# Patient Record
Sex: Male | Born: 1995 | Race: White | Hispanic: No | Smoking: Never smoker
Health system: Southern US, Community
[De-identification: ages and names within clinical notes are randomized; demographics above are authoritative.]

## PROBLEM LIST (undated history)

## (undated) DIAGNOSIS — K219 Gastro-esophageal reflux disease without esophagitis: Secondary | ICD-10-CM

---

## 2014-06-26 ENCOUNTER — Ambulatory Visit: Payer: Self-pay | Admitting: Family Medicine

## 2014-06-26 ENCOUNTER — Encounter (INDEPENDENT_AMBULATORY_CARE_PROVIDER_SITE_OTHER): Payer: Self-pay

## 2014-06-26 ENCOUNTER — Encounter: Payer: Self-pay | Admitting: Family Medicine

## 2014-06-26 VITALS — BP 111/62 | HR 53 | Temp 97.7°F | Ht 71.0 in | Wt 150.0 lb

## 2014-06-26 DIAGNOSIS — L255 Unspecified contact dermatitis due to plants, except food: Secondary | ICD-10-CM

## 2014-06-26 DIAGNOSIS — L237 Allergic contact dermatitis due to plants, except food: Secondary | ICD-10-CM

## 2014-06-26 NOTE — Patient Instructions (Signed)
Poison Oak Poison oak is an inflammation of the skin (contact dermatitis). It is caused by contact with the allergens on the leaves of the oak (toxicodendron) plants. Depending on your sensitivity, the rash may consist simply of redness and itching, or it may also progress to blisters which may break open (rupture). These must be well cared for to prevent secondary germ (bacterial) infection as these infections can lead to scarring. The eyes may also get puffy. The puffiness is worst in the morning and gets better as the day progresses. Healing is best accomplished by keeping any open areas dry, clean, covered with a bandage, and covered with an antibacterial ointment if needed. Without secondary infection, this dermatitis usually heals without scarring within 2 to 3 weeks without treatment. HOME CARE INSTRUCTIONS When you have been exposed to poison oak, it is very important to thoroughly wash with soap and water as soon as the exposure has been discovered. You have about one half hour to remove the plant resin before it will cause the rash. This cleaning will quickly destroy the oil or antigen on the skin (the antigen is what causes the rash). Wash aggressively under the fingernails as any plant resin still there will continue to spread the rash. Do not rub skin vigorously when washing affected area. Poison oak cannot spread if no oil from the plant remains on your body. Rash that has progressed to weeping sores (lesions) will not spread the rash unless you have not washed thoroughly. It is also important to clean any clothes you have been wearing as they may carry active allergens which will spread the rash, even several days later. Avoidance of the plant in the future is the best measure. Poison oak plants can be recognized by the number of leaves. Generally, poison oak has three leaves with flowering branches on a single stem. Diphenhydramine may be purchased over the counter and used as needed for  itching. Do not drive with this medication if it makes you drowsy. Ask your caregiver about medication for children. SEEK IMMEDIATE MEDICAL CARE IF:   Open areas of the rash develop.  You notice redness extending beyond the area of the rash.  There is a pus like discharge.  There is increased pain.  Other signs of infection develop (such as fever). Document Released: 05/19/2003 Document Revised: 02/04/2012 Document Reviewed: 09/28/2009 ExitCare Patient Information 2015 [employer]. This information is not intended to replace advice given to you by your health care provider. Make sure you discuss any questions you have with your health care provider.  

## 2014-06-26 NOTE — Progress Notes (Signed)
   Subjective:    Patient ID: Bradley BignessRyan Dorsey, male    DOB: 05-31-1996, 18 y.o.   MRN: [id number]  HPI This [age] y.o. male presents for evaluation of contact dermatitis and rash on forearms from poison oak exposure. He was out camping and was in poison oak and has rash on arms that is resolving. He has been using caladryl cream and it is a lot better.  He is joining the [employer] and [employer] wants a letter addressing his rash from his PCP.   Review of Systems C/o rash bilateral arms No chest pain, SOB, HA, dizziness, vision change, N/V, diarrhea, constipation, dysuria, urinary urgency or frequency, myalgias, arthralgias.     Objective:   Physical Exam  Vital signs noted  Well developed well nourished male.  HEENT - Head atraumatic Normocephalic                Eyes - PERRLA, Conjuctiva - clear Sclera- Clear EOMI                Ears - EAC's Wnl TM's Wnl Gross Hearing WNL Respiratory - Lungs CTA bilateral Cardiac - RRR S1 and S2 without murmur GI - Abdomen soft Nontender and bowel sounds active x 4 Skin - erythematous macular papular rash on arms.      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  Deatra CanterWilliam J Oxford FNP

## 2020-04-01 ENCOUNTER — Ambulatory Visit
Admission: EM | Admit: 2020-04-01 | Discharge: 2020-04-01 | Disposition: A | Discharge status: Home / Self Care | Payer: 59 | Attending: Emergency Medicine | Admitting: Emergency Medicine

## 2020-04-01 ENCOUNTER — Telehealth: Payer: Self-pay

## 2020-04-01 ENCOUNTER — Other Ambulatory Visit: Payer: Self-pay

## 2020-04-01 ENCOUNTER — Encounter: Payer: Self-pay | Admitting: Emergency Medicine

## 2020-04-01 DIAGNOSIS — Z113 Encounter for screening for infections with a predominantly sexual mode of transmission: Secondary | ICD-10-CM | POA: Diagnosis not present

## 2020-04-01 DIAGNOSIS — R300 Dysuria: Secondary | ICD-10-CM | POA: Diagnosis not present

## 2020-04-01 DIAGNOSIS — J302 Other seasonal allergic rhinitis: Secondary | ICD-10-CM | POA: Diagnosis not present

## 2020-04-01 LAB — POCT URINALYSIS DIP (MANUAL ENTRY)
Bilirubin, UA: NEGATIVE
Glucose, UA: NEGATIVE mg/dL
Ketones, POC UA: NEGATIVE mg/dL
Leukocytes, UA: NEGATIVE
Nitrite, UA: NEGATIVE
Protein Ur, POC: NEGATIVE mg/dL
Spec Grav, UA: 1.025 (ref 1.010–1.025)
Urobilinogen, UA: 0.2 E.U./dL
pH, UA: 6 (ref 5.0–8.0)

## 2020-04-01 MED ORDER — PHENAZOPYRIDINE HCL 100 MG PO TABS: 100.0000 mg | ORAL_TABLET | Freq: Three times a day (TID) | ORAL | 0 refills | Status: DC | PRN

## 2020-04-01 MED ORDER — FLUTICASONE PROPIONATE 50 MCG/ACT NA SUSP
1.0000 | Freq: Every day | NASAL | 0 refills | Status: DC
Start: 2020-04-01 — End: 2020-04-01

## 2020-04-01 MED ORDER — FLUTICASONE PROPIONATE 50 MCG/ACT NA SUSP: 1.0000 | Freq: Every day | NASAL | 0 refills | Status: DC

## 2020-04-01 MED ORDER — PHENAZOPYRIDINE HCL 100 MG PO TABS
100.0000 mg | ORAL_TABLET | Freq: Three times a day (TID) | ORAL | 0 refills | Status: DC | PRN
Start: 2020-04-01 — End: 2020-04-01

## 2020-04-01 NOTE — Discharge Instructions (Signed)
POCT urine analysis showed trace of blood.  Inconclusive for UTI. Urine will be sent for culture.  Someone will call with abnormal result. STD screening was completed Pyridium will be prescribed for symptomatic relief.  Continue to take Claritin as prescribed Flonase was prescribed Rest and push fluids Return or follow up with PCP in 24 hours to be reevaluated and to ensure your symptoms are improving Return sooner or go to the ED if you have any new or worsening symptoms such as difficulty breathing, shortness of breath, chest pain, nausea, vomiting, throat tightness or swelling, tongue swelling or tingling, worsening lip or facial swelling, abdominal pain, changes in bowel or bladder habits, no improvement despite medications, etc..Marland Kitchen

## 2020-04-01 NOTE — ED Provider Notes (Signed)
[facility]   [id number] 04/01/20 Arrival Time: 6301  Cc: Allergic reaction/ dysuria   SUBJECTIVE:  Bradley Dorsey is a 24 y.o. male who presents with possible allergic reaction that began 4-5 days ago.  Denies precipitating event, exposure, known allergy or trigger. Denies changes in medication or starting a new medication.  Has tried OTC medication without relief.  Symptoms are made worse with being outdoor.  Reports previous symptoms in the past.   Denies fever, chills, nausea, vomiting, erythema, redness, swollen glands, oral manifestations such as throat swelling/ tingling, mouth swelling/ tingling, tongue swelling/tingling, dyspnea, SOB, chest pain, abdominal pain, changes in bowel or bladder function.     He is also reporting dysuria for the past 3 days.  Denies any precipitating event.  Denies penile discharge.  Reports history of STD and UTI in the past.  ROS: As per HPI.  All other pertinent ROS negative.     History reviewed. No pertinent past medical history. History reviewed. No pertinent surgical history. No Known Allergies No current facility-administered medications on file prior to encounter.   No current outpatient medications on file prior to encounter.    Social History   Socioeconomic History  . Marital status: Single    Spouse name: Not on file  . Number of children: Not on file  . Years of education: Not on file  . Highest education level: Not on file  Occupational History  . Not on file  Tobacco Use  . Smoking status: Never Smoker  . Smokeless tobacco: Never Used  Substance and Sexual Activity  . Alcohol use: No  . Drug use: No  . Sexual activity: Yes  Other Topics Concern  . Not on file  Social History Narrative  . Not on file   Social Determinants of Health   Financial Resource Strain:   . Difficulty of Paying Living Expenses:   Food Insecurity:   . Worried About Programme researcher, broadcasting/film/video in the Last Year:   . Barista in the Last  Year:   Transportation Needs:   . Freight forwarder (Medical):   Marland Kitchen Lack of Transportation (Non-Medical):   Physical Activity:   . Days of Exercise per Week:   . Minutes of Exercise per Session:   Stress:   . Feeling of Stress :   Social Connections:   . Frequency of Communication with Friends and Family:   . Frequency of Social Gatherings with Friends and Family:   . Attends Religious Services:   . Active Member of Clubs or Organizations:   . Attends Banker Meetings:   Marland Kitchen Marital Status:   Intimate Partner Violence:   . Fear of Current or Ex-Partner:   . Emotionally Abused:   Marland Kitchen Physically Abused:   . Sexually Abused:    History reviewed. No pertinent family history.   OBJECTIVE:  Vitals:   04/01/20 0811  BP: 115/76  Pulse: (!) 59  Resp: 16  Temp: 98.1 F (36.7 C)  TempSrc: Oral  SpO2: 98%     General appearance: Alert, speaking in full sentences without difficulty HEENT:NCAT; no obvious facial swelling; Ears: EACs clear, TMs pearly gray; Eyes: PERRL.  EOM grossly intact. Nose: nares patent without rhinorrhea; Throat: tonsils nonerythematous or enlarged, uvula midline Neck: supple without LAD Lungs: clear to auscultation bilaterally without adventitious breath sounds; normal respiratory effort; no labored respirations Heart: regular rate and rhythm.  Radial pulses 2+ symmetrical bilaterally; cap refill < 2 seconds Abdomen: soft, nondistended,  normal active bowel sounds; nontender to palpation; no guarding, no CVA tenderness Skin: warm and dry Psychological: alert and cooperative; normal mood and affect  ASSESSMENT & PLAN:  1. Seasonal allergies   2. Dysuria   3. Screen for STD (sexually transmitted disease)     Meds ordered this encounter  Medications  . fluticasone (FLONASE) 50 MCG/ACT nasal spray    Sig: Place 1 spray into both nostrils daily for 14 days.    Dispense:  16 g    Refill:  0  . phenazopyridine (PYRIDIUM) 100 MG tablet     Sig: Take 1 tablet (100 mg total) by mouth 3 (three) times daily as needed for pain.    Dispense:  15 tablet    Refill:  0    Orders Placed This Encounter  Procedures  . POCT urinalysis dipstick    Standing Status:   Standing    Number of Occurrences:   1     Discharge instruction POCT urine analysis showed trace of blood.  Inconclusive for UTI. Urine will be sent for culture.  Someone will call with abnormal result. STD screening was completed Pyridium will be prescribed for symptomatic relief.  Continue to take Claritin as prescribed for allergy Flonase was prescribed Rest and push fluids Return or follow up with PCP in 24 hours to be reevaluated and to ensure your symptoms are improving Return sooner or go to the ED if you have any new or worsening symptoms such as difficulty breathing, shortness of breath, chest pain, nausea, vomiting, throat tightness or swelling, tongue swelling or tingling, worsening lip or facial swelling, abdominal pain, changes in bowel or bladder habits, no improvement despite medications, etc...   Reviewed expectations re: course of current medical issues. Questions answered. Outlined signs and symptoms indicating need for more acute intervention. Patient verbalized understanding. After Visit Summary given.          Emerson Monte,  [place] 04/01/20 [phone]

## 2020-04-01 NOTE — ED Triage Notes (Signed)
Pt presents with c/o nasal congestion and dysuria, reports h/o kidney stones

## 2020-04-04 LAB — CYTOLOGY, (ORAL, ANAL, URETHRAL) ANCILLARY ONLY
Chlamydia: NEGATIVE
Comment: NEGATIVE
Comment: NEGATIVE
Comment: NORMAL
Neisseria Gonorrhea: NEGATIVE
Trichomonas: NEGATIVE

## 2020-04-07 ENCOUNTER — Other Ambulatory Visit: Payer: Self-pay

## 2020-04-07 ENCOUNTER — Ambulatory Visit
Admission: EM | Admit: 2020-04-07 | Discharge: 2020-04-07 | Disposition: A | Discharge status: Home / Self Care | Payer: No Typology Code available for payment source | Attending: Emergency Medicine | Admitting: Emergency Medicine

## 2020-04-07 DIAGNOSIS — R002 Palpitations: Secondary | ICD-10-CM | POA: Diagnosis not present

## 2020-04-07 NOTE — ED Triage Notes (Signed)
Pt presents with c/o heart palpations on and of for past month or more. Pt states he feels heart flutter about 3 times today

## 2020-04-07 NOTE — Discharge Instructions (Addendum)
Your EKG showed normal sinus bradycardia with sinus arrhythmia otherwise is a normal EKG. Follow-up with PCP for further evaluation and referral to cardiology Avoid coffee, tea, soft drinks and energy drinks, chocolate, alcohol, smoking. Effective stress management is recommended

## 2020-04-07 NOTE — ED Provider Notes (Addendum)
[facility]    CSN: [id number] Arrival date & time: 04/07/20  1400      History   Chief Complaint Chief Complaint  Patient presents with  . Palpitations    HPI Bradley Dorsey is a 24 y.o. male.   Presented to the urgent care for complaint of heart palpitations for the past month.  Denies a precipitating event, or trauma.  Describes the episodes as  rapid fluttering.  Denies any precipitating or alleviating factor.  Has not used or tried any OTC medication.  Denies worsening symptoms or unidentified aggravating factors.  Reports similar symptoms in the past.  Denies fever, chills, nausea, vomiting, chest pain, SOB, syncope, dizziness, abdominal pain, weakness, changes in bowel or bladder habits.    The history is provided by the patient. No language interpreter was used.    No past medical history on file.  There are no problems to display for this patient.   No past surgical history on file.     Home Medications    Prior to Admission medications   Medication Sig Start Date End Date Taking? Authorizing Provider  fluticasone (FLONASE) 50 MCG/ACT nasal spray Place 1 spray into both nostrils daily for 14 days. 04/01/20 04/15/20  Jurnie Garritano, Zachery Dakins, FNP  phenazopyridine (PYRIDIUM) 100 MG tablet Take 1 tablet (100 mg total) by mouth 3 (three) times daily as needed for pain. 04/01/20   Jorel Gravlin, Zachery Dakins, FNP    Family History No family history on file.  Social History Social History   Tobacco Use  . Smoking status: Never Smoker  . Smokeless tobacco: Never Used  Substance Use Topics  . Alcohol use: Yes  . Drug use: No     Allergies   Patient has no known allergies.   Review of Systems Review of Systems  Constitutional: Negative.   HENT: Negative.   Respiratory: Negative.   Cardiovascular: Positive for palpitations.  Neurological: Negative.   All other systems reviewed and are negative.    Physical Exam Triage Vital Signs ED Triage Vitals  [04/07/20 1409]  Enc Vitals Group     BP      Pulse      Resp      Temp      Temp src      SpO2      Weight      Height      Head Circumference      Peak Flow      Pain Score 0     Pain Loc      Pain Edu?      Excl. in GC?    No data found.  Updated Vital Signs BP 130/79   Pulse (!) 54   Temp 97.9 F (36.6 C)   Resp 18   SpO2 98%   Visual Acuity Right Eye Distance:   Left Eye Distance:   Bilateral Distance:    Right Eye Near:   Left Eye Near:    Bilateral Near:     Physical Exam Vitals and nursing note reviewed.  Constitutional:      General: He is not in acute distress.    Appearance: Normal appearance. He is normal weight. He is not ill-appearing, toxic-appearing or diaphoretic.  Neck:     Vascular: No carotid bruit.  Cardiovascular:     Rate and Rhythm: Regular rhythm. Bradycardia present.     Pulses: Normal pulses.     Heart sounds: Normal heart sounds. No murmur. No friction rub. No  gallop.   Pulmonary:     Effort: Pulmonary effort is normal. No respiratory distress.     Breath sounds: Normal breath sounds. No stridor. No wheezing, rhonchi or rales.  Chest:     Chest wall: No tenderness.  Musculoskeletal:     Cervical back: Normal range of motion. No rigidity or tenderness.     Right lower leg: No edema.     Left lower leg: No edema.  Lymphadenopathy:     Cervical: No cervical adenopathy.  Neurological:     Mental Status: He is alert and oriented to person, place, and time.     Cranial Nerves: No cranial nerve deficit.     Sensory: No sensory deficit.     Motor: No weakness.     Coordination: Coordination normal.     Gait: Gait normal.     Deep Tendon Reflexes: Reflexes normal.      UC Treatments / Results  Labs (all labs ordered are listed, but only abnormal results are displayed) Labs Reviewed - No data to display  EKG   Radiology No results found.  Procedures Procedures (including critical care time)  Medications Ordered in  UC Medications - No data to display  Initial Impression / Assessment and Plan / UC Course  I have reviewed the triage vital signs and the nursing notes.  Pertinent labs & imaging results that were available during my care of the patient were reviewed by me and considered in my medical decision making (see chart for details).     Patient is stable at discharge.    EKG shows sinus bradycardia with sinus arrhythmia, otherwise normal EKG.  No narrowing or widening of the QRS complexes.  I have reviewed these EKG  with Dr. Lynelle Doctor.     Patient was advised to follow-up with PCP for further evaluation and possible referral to cardiology.  Final Clinical Impressions(s) / UC Diagnoses   Final diagnoses:  Heart palpitations     Discharge Instructions     Your EKG showed normal sinus bradycardia with sinus arrhythmia otherwise is a normal EKG. Follow-up with PCP for further evaluation and referral to cardiology Avoid coffee, tea, soft drinks and energy drinks, chocolate, alcohol, smoking. Effective stress management is recommended    ED Prescriptions    None     PDMP not reviewed this encounter.   Emerson Monte, FNP 04/07/20 1443    Emerson Monte, FNP 04/07/20 1449    Emerson Monte, FNP 04/07/20 1450

## 2020-04-18 ENCOUNTER — Other Ambulatory Visit: Payer: Self-pay

## 2020-04-18 ENCOUNTER — Ambulatory Visit
Admission: EM | Admit: 2020-04-18 | Discharge: 2020-04-18 | Disposition: A | Discharge status: Home / Self Care | Payer: 59 | Attending: Emergency Medicine | Admitting: Emergency Medicine

## 2020-04-18 DIAGNOSIS — Z113 Encounter for screening for infections with a predominantly sexual mode of transmission: Secondary | ICD-10-CM

## 2020-04-18 DIAGNOSIS — R300 Dysuria: Secondary | ICD-10-CM

## 2020-04-18 MED ORDER — AZITHROMYCIN 500 MG PO TABS
1000.0000 mg | ORAL_TABLET | Freq: Once | ORAL | Status: AC
  Administered 2020-04-18: 1000 mg via ORAL

## 2020-04-18 MED ORDER — CEFTRIAXONE SODIUM 500 MG IJ SOLR
500.0000 mg | Freq: Once | INTRAMUSCULAR | Status: AC
  Administered 2020-04-18: 500 mg via INTRAMUSCULAR

## 2020-04-18 NOTE — Discharge Instructions (Addendum)
Given rocephin 500 mg injection and azithromycin 1g in office Urethral swab obtained HIV/ syphilis testing today We will follow up with you regarding the results of your test If tests are positive, please abstain from sexual activity until you and your partner(s) are treated Follow up with PCP as needed Return here or go to ER if you have any new or worsening symptoms fever, chills, nausea, vomiting, redness, swelling, discharge, worsening symptoms despite treatment, etc..Marland Kitchen

## 2020-04-18 NOTE — ED Provider Notes (Signed)
[facility]   [id number] 04/18/20 Arrival Time: 3474   CC: CONCERN FOR STD  SUBJECTIVE:  Bradley Dorsey is a 24 y.o. male who presents requesting STI screening.  Dysuria x 1 week. Concerned for chlamydia.   Partner asymptomatic.  Last unprotected sexual encounter 2 weeks ago.  Sexually active with 3 male partners.  Denies similar symptoms in the past.  Denies fever, chills, nausea, vomiting, abdominal or pelvic pain, penile rashes or lesions, testicular swelling or pain.     ROS: As per HPI.  All other pertinent ROS negative.     History reviewed. No pertinent past medical history. History reviewed. No pertinent surgical history. No Known Allergies No current facility-administered medications on file prior to encounter.   Current Outpatient Medications on File Prior to Encounter  Medication Sig Dispense Refill  . [DISCONTINUED] fluticasone (FLONASE) 50 MCG/ACT nasal spray Place 1 spray into both nostrils daily for 14 days. 16 g 0   Social History   Socioeconomic History  . Marital status: Single    Spouse name: Not on file  . Number of children: Not on file  . Years of education: Not on file  . Highest education level: Not on file  Occupational History  . Not on file  Tobacco Use  . Smoking status: Never Smoker  . Smokeless tobacco: Never Used  Substance and Sexual Activity  . Alcohol use: Yes  . Drug use: No  . Sexual activity: Yes  Other Topics Concern  . Not on file  Social History Narrative  . Not on file   Social Determinants of Health   Financial Resource Strain:   . Difficulty of Paying Living Expenses:   Food Insecurity:   . Worried About Charity fundraiser in the Last Year:   . Arboriculturist in the Last Year:   Transportation Needs:   . Film/video editor (Medical):   Marland Kitchen Lack of Transportation (Non-Medical):   Physical Activity:   . Days of Exercise per Week:   . Minutes of Exercise per Session:   Stress:   . Feeling of Stress :    Social Connections:   . Frequency of Communication with Friends and Family:   . Frequency of Social Gatherings with Friends and Family:   . Attends Religious Services:   . Active Member of Clubs or Organizations:   . Attends Archivist Meetings:   Marland Kitchen Marital Status:   Intimate Partner Violence:   . Fear of Current or Ex-Partner:   . Emotionally Abused:   Marland Kitchen Physically Abused:   . Sexually Abused:    Family History  Problem Relation Age of Onset  . Healthy Mother   . Healthy Father     OBJECTIVE:  Vitals:   04/18/20 1801  BP: 125/75  Pulse: (!) 54  Resp: 17  Temp: 97.8 F (36.6 C)  TempSrc: Oral  SpO2: 96%     General appearance: alert, NAD, appears stated age Head: NCAT Throat: lips, mucosa, and tongue normal; teeth and gums normal Lungs: CTA bilaterally without adventitious breath sounds Heart: regular rate and rhythm.   Back: no CVA tenderness Abdomen: soft, non-tender; bowel sounds normal; no guarding GU: deferred Skin: warm and dry Psychological:  Alert and cooperative. Normal mood and affect.  LABS:  Results for orders placed or performed during the hospital encounter of 04/01/20  POCT urinalysis dipstick  Result Value Ref Range   Color, UA yellow yellow   Clarity, UA clear clear  Glucose, UA negative negative mg/dL   Bilirubin, UA negative negative   Ketones, POC UA negative negative mg/dL   Spec Grav, UA 9.211 9.417 - 1.025   Blood, UA trace-intact (A) negative   pH, UA 6.0 5.0 - 8.0   Protein Ur, POC negative negative mg/dL   Urobilinogen, UA 0.2 0.2 or 1.0 E.U./dL   Nitrite, UA Negative Negative   Leukocytes, UA Negative Negative  Cytology (oral, anal, urethral) ancillary only  Result Value Ref Range   Neisseria Gonorrhea Negative    Chlamydia Negative    Trichomonas Negative    Comment Normal Reference Range Trichomonas - Negative    Comment Normal Reference Ranger Chlamydia - Negative    Comment      Normal Reference Range  Neisseria Gonorrhea - Negative    Labs Reviewed  HIV ANTIBODY (ROUTINE TESTING W REFLEX)  [facility]  CYTOLOGY, (ORAL, ANAL, URETHRAL) ANCILLARY ONLY    ASSESSMENT & PLAN:  1. Dysuria   2. Screening examination for venereal disease     Meds ordered this encounter  Medications  . cefTRIAXone (ROCEPHIN) injection 500 mg  . azithromycin (ZITHROMAX) tablet 1,000 mg    Pending: Labs Reviewed  HIV ANTIBODY (ROUTINE TESTING W REFLEX)  [facility]  CYTOLOGY, (ORAL, ANAL, URETHRAL) ANCILLARY ONLY    Given rocephin 500 mg injection and azithromycin 1g in office Urethral swab obtained HIV/ syphilis testing today We will follow up with you regarding the results of your test If tests are positive, please abstain from sexual activity until you and your partner(s) are treated Follow up with PCP as needed Return here or go to ER if you have any new or worsening symptoms fever, chills, nausea, vomiting, redness, swelling, discharge, worsening symptoms despite treatment, etc...  Reviewed expectations re: course of current medical issues. Questions answered. Outlined signs and symptoms indicating need for more acute intervention. Patient verbalized understanding. After Visit Summary given.       Rennis Harding, PA-C 04/18/20 1817

## 2020-04-18 NOTE — ED Triage Notes (Addendum)
Pt presents with complaints of dysuria x over 1 week. Pt is concerned for chlamydia. Pt denies any other symptoms. Pt was recently treated for a uti with no relief which made him think the potential for STD. Pt states they also tested him for STD's and was negative, pt is concerned he did not preform self test correctly and would like to be retested.

## 2020-04-19 LAB — CYTOLOGY, (ORAL, ANAL, URETHRAL) ANCILLARY ONLY
Chlamydia: NEGATIVE
Comment: NEGATIVE
Comment: NEGATIVE
Comment: NORMAL
Neisseria Gonorrhea: NEGATIVE
Trichomonas: NEGATIVE

## 2020-04-19 LAB — HIV ANTIBODY (ROUTINE TESTING W REFLEX): HIV Screen 4th Generation wRfx: NONREACTIVE

## 2020-04-19 LAB — RPR: RPR Ser Ql: NONREACTIVE

## 2020-07-05 ENCOUNTER — Other Ambulatory Visit: Payer: Self-pay

## 2020-07-05 ENCOUNTER — Encounter (HOSPITAL_COMMUNITY): Payer: Self-pay | Admitting: Emergency Medicine

## 2020-07-05 ENCOUNTER — Ambulatory Visit
Admission: EM | Admit: 2020-07-05 | Discharge: 2020-07-05 | Disposition: A | Discharge status: Home / Self Care | Payer: 59 | Attending: Emergency Medicine | Admitting: Emergency Medicine

## 2020-07-05 ENCOUNTER — Encounter: Payer: Self-pay | Admitting: Emergency Medicine

## 2020-07-05 ENCOUNTER — Emergency Department (HOSPITAL_COMMUNITY)
Admission: EM | Admit: 2020-07-05 | Discharge: 2020-07-05 | Disposition: A | Discharge status: Home / Self Care | Payer: No Typology Code available for payment source | Attending: Emergency Medicine | Admitting: Emergency Medicine

## 2020-07-05 DIAGNOSIS — R5383 Other fatigue: Secondary | ICD-10-CM | POA: Insufficient documentation

## 2020-07-05 DIAGNOSIS — R0789 Other chest pain: Secondary | ICD-10-CM | POA: Diagnosis not present

## 2020-07-05 DIAGNOSIS — J069 Acute upper respiratory infection, unspecified: Secondary | ICD-10-CM

## 2020-07-05 DIAGNOSIS — J209 Acute bronchitis, unspecified: Secondary | ICD-10-CM | POA: Insufficient documentation

## 2020-07-05 DIAGNOSIS — R05 Cough: Secondary | ICD-10-CM | POA: Diagnosis not present

## 2020-07-05 DIAGNOSIS — Z5321 Procedure and treatment not carried out due to patient leaving prior to being seen by health care provider: Secondary | ICD-10-CM | POA: Diagnosis not present

## 2020-07-05 DIAGNOSIS — Z20822 Contact with and (suspected) exposure to covid-19: Secondary | ICD-10-CM | POA: Diagnosis not present

## 2020-07-05 LAB — SARS CORONAVIRUS 2 BY RT PCR (HOSPITAL ORDER, PERFORMED IN ~~LOC~~ HOSPITAL LAB): SARS Coronavirus 2: NEGATIVE

## 2020-07-05 MED ORDER — PREDNISONE 10 MG PO TABS
20.0000 mg | ORAL_TABLET | Freq: Every day | ORAL | 0 refills | Status: DC
Start: 2020-07-05 — End: 2020-07-05

## 2020-07-05 MED ORDER — PREDNISONE 10 MG PO TABS
20.0000 mg | ORAL_TABLET | Freq: Every day | ORAL | 0 refills | Status: DC
Start: 2020-07-05 — End: 2020-10-20

## 2020-07-05 NOTE — ED Provider Notes (Signed)
[facility] CARE CENTER   [id number] 07/05/20 Arrival Time: 1639   Chief Complaint  Patient presents with  . Cough  . Fatigue     SUBJECTIVE: History from: patient.  Bradley Dorsey is a 24 y.o. male who presents who presented to the urgent care for complaint of cough, fatigue, body ache for the past few 2 weeks.  Was seen in ER and was given azithromycin.  Patient went to [facility], ED has a rapid Covid test done today.  Is negative.  Denies sick exposure to COVID, flu or strep.  Denies recent travel.  Denies alleviating or aggravating factors.  Denies previous symptoms in the past.   Denies fever, chills, fatigue, sinus pain, rhinorrhea, sore throat, SOB, wheezing, chest pain, nausea, changes in bowel or bladder habits.    While in office patient state he is having some mild chest pain.  Reported chest pain as stabbing and sharp and burning at times.  Report he has history of abnormal heart rhythm.  Denies any precipitating event.  ROS: As per HPI.  All other pertinent ROS negative.     History reviewed. No pertinent past medical history. History reviewed. No pertinent surgical history. No Known Allergies No current facility-administered medications on file prior to encounter.   Current Outpatient Medications on File Prior to Encounter  Medication Sig Dispense Refill  . [DISCONTINUED] fluticasone (FLONASE) 50 MCG/ACT nasal spray Place 1 spray into both nostrils daily for 14 days. 16 g 0   Social History   Socioeconomic History  . Marital status: Single    Spouse name: Not on file  . Number of children: Not on file  . Years of education: Not on file  . Highest education level: Not on file  Occupational History  . Not on file  Tobacco Use  . Smoking status: Never Smoker  . Smokeless tobacco: Never Used  Substance and Sexual Activity  . Alcohol use: Yes  . Drug use: No  . Sexual activity: Yes  Other Topics Concern  . Not on file  Social History Narrative  . Not on file    Social Determinants of Health   Financial Resource Strain:   . Difficulty of Paying Living Expenses:   Food Insecurity:   . Worried About Programme researcher, broadcasting/film/video in the Last Year:   . Barista in the Last Year:   Transportation Needs:   . Freight forwarder (Medical):   Marland Kitchen Lack of Transportation (Non-Medical):   Physical Activity:   . Days of Exercise per Week:   . Minutes of Exercise per Session:   Stress:   . Feeling of Stress :   Social Connections:   . Frequency of Communication with Friends and Family:   . Frequency of Social Gatherings with Friends and Family:   . Attends Religious Services:   . Active Member of Clubs or Organizations:   . Attends Banker Meetings:   Marland Kitchen Marital Status:   Intimate Partner Violence:   . Fear of Current or Ex-Partner:   . Emotionally Abused:   Marland Kitchen Physically Abused:   . Sexually Abused:    Family History  Problem Relation Age of Onset  . Healthy Mother   . Healthy Father     OBJECTIVE:  Vitals:   07/05/20 1646 07/05/20 1654  BP: 106/70   Pulse: 60   Resp: 16   Temp: 98.4 F (36.9 C)   TempSrc: Oral   SpO2: 98%   Weight:  182  lb 15.7 oz (83 kg)  Height:  5\' 11"  (1.803 m)     General appearance: alert; appears fatigued, but nontoxic; speaking in full sentences and tolerating own secretions HEENT: NCAT; Ears: EACs clear, TMs pearly gray; Eyes: PERRL.  EOM grossly intact. Sinuses: nontender; Nose: nares patent without rhinorrhea, Throat: oropharynx clear, tonsils non erythematous or enlarged, uvula midline  Neck: supple without LAD Lungs: unlabored respirations, symmetrical air entry; cough: present; no respiratory distress; CTAB Heart: regular rate and rhythm.  Radial pulses 2+ symmetrical bilaterally Skin: warm and dry Psychological: alert and cooperative; normal mood and affect  LABS:  Results for orders placed or performed during the hospital encounter of 07/05/20 (from the past 24 hour(s))  SARS  Coronavirus 2 by RT PCR (hospital order, performed in [facility] hospital lab) Nasopharyngeal Nasopharyngeal Swab     Status: None   Collection Time: 07/05/20 12:13 PM   Specimen: Nasopharyngeal Swab  Result Value Ref Range   SARS Coronavirus 2 NEGATIVE NEGATIVE     ASSESSMENT & PLAN:  1. Viral URI with cough   2. Other chest pain     Meds ordered this encounter  Medications  . predniSONE (DELTASONE) 10 MG tablet    Sig: Take 2 tablets (20 mg total) by mouth daily.    Dispense:  15 tablet    Refill:  0   Patient is stable at discharge.  ED EKG showed sinus bradycardia with ST elevation, suggesting early  repolarization and or pericarditis.  EKG was reviewed with Dr.Haggler.09/04/20  He was advised to follow-up with PCP for referral to see a cardiologist.  Discharge Instructions   Get plenty of rest and push fluids Continue to take Tessalon Perles  for cough Low-dose prednisone was prescribed Follow-up and establish care with a PCP for referral to see a cardiologist Use medications daily for symptom relief Use OTC medications like ibuprofen or tylenol as needed fever or pain Call or go to the ED if you have any new or worsening symptoms such as fever, worsening cough, shortness of breath, chest tightness, chest pain, turning blue, changes in mental status, etc...   Reviewed expectations re: course of current medical issues. Questions answered. Outlined signs and symptoms indicating need for more acute intervention. Patient verbalized understanding. After Visit Summary given.      Note: This document was prepared using Dragon voice recognition software and may include unintentional dictation errors.    Marland Kitchen, FNP 07/05/20 [phone]

## 2020-07-05 NOTE — ED Triage Notes (Signed)
Pt states for the last 2 weeks has been having a non productive cough feeling fatigue. Pt denies fever or sob. Pt states he was diagnosed with bronchitis 1 week ago finished antibiotics but no better. Pt states he did have a chest xray but was not tested for covid.

## 2020-07-05 NOTE — Discharge Instructions (Addendum)
Get plenty of rest and push fluids Continue to take Tessalon Perles  for cough Low-dose prednisone was prescribed Use medications daily for symptom relief Use OTC medications like ibuprofen or tylenol as needed fever or pain Call or go to the ED if you have any new or worsening symptoms such as fever, worsening cough, shortness of breath, chest tightness, chest pain, turning blue, changes in mental status, etc..Marland Kitchen

## 2020-07-05 NOTE — ED Triage Notes (Signed)
Cough, body aches. Dx with bronchitis given abx last week. Had recent covid test. Still awaiting results.

## 2020-07-19 ENCOUNTER — Encounter (HOSPITAL_COMMUNITY): Payer: Self-pay | Admitting: Emergency Medicine

## 2020-07-19 ENCOUNTER — Other Ambulatory Visit: Payer: Self-pay

## 2020-07-19 DIAGNOSIS — Z79899 Other long term (current) drug therapy: Secondary | ICD-10-CM | POA: Insufficient documentation

## 2020-07-19 DIAGNOSIS — R001 Bradycardia, unspecified: Secondary | ICD-10-CM | POA: Insufficient documentation

## 2020-07-19 DIAGNOSIS — J40 Bronchitis, not specified as acute or chronic: Secondary | ICD-10-CM | POA: Insufficient documentation

## 2020-07-19 NOTE — ED Triage Notes (Signed)
Pt has a cough that has persisted for 4 weeks. Pt evaluated for the same. Last covid was negative 2 weeks ago.

## 2020-07-20 ENCOUNTER — Emergency Department (HOSPITAL_COMMUNITY)
Admission: EM | Admit: 2020-07-20 | Discharge: 2020-07-20 | Disposition: A | Discharge status: Home / Self Care | Payer: No Typology Code available for payment source | Attending: Emergency Medicine | Admitting: Emergency Medicine

## 2020-07-20 DIAGNOSIS — J40 Bronchitis, not specified as acute or chronic: Secondary | ICD-10-CM

## 2020-07-20 HISTORY — DX: Gastro-esophageal reflux disease without esophagitis: K21.9

## 2020-07-20 MED ORDER — AMOXICILLIN 500 MG PO CAPS
500.0000 mg | ORAL_CAPSULE | Freq: Three times a day (TID) | ORAL | 0 refills | Status: DC
Start: 2020-07-20 — End: 2020-10-20

## 2020-07-20 NOTE — Discharge Instructions (Addendum)
Take the antibiotic until gone.  Drink plenty of fluids.  You can continue the Delsym or take Mucinex DM over-the-counter for cough.  Recheck if you get a fever, struggle to breathe or seem worse.

## 2020-07-20 NOTE — ED Provider Notes (Signed)
[facility] EMERGENCY DEPARTMENT Provider Note   CSN: [id number] Arrival date & time: 07/19/20  2226   Time seen 3:45 AM  History Chief Complaint  Patient presents with  . Cough    Bradley Dorsey is a 24 y.o. male.  HPI   Patient states he has had a dry cough for the past 4 weeks.  He denies feeling short of breath or having fever.  He denies wheezing.  He states it sort of comes and goes.  He states he was seen at the urgent care which turned out to be August 10 in [place] and was prescribed prednisone.  He then was seen at [facility] in [place] and was taken off the prednisone and put on omeprazole.  He states his cough is not worse that she is not going away.  He states he has a scratchy throat but denies rhinorrhea or loss of taste or smell.  He states the past week his chest is tender if he touches it, it does not hurt if he coughs and he does not have pain unless he touches it.  He denies nausea, vomiting, or diarrhea.  He denies smoking or being around secondhand smoke.  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  PCP Patient, No Pcp Per   Past Medical History:  Diagnosis Date  . GERD (gastroesophageal reflux disease)     There are no problems to display for this patient.   History reviewed. No pertinent surgical history.     Family History  Problem Relation Age of Onset  . Healthy Mother   . Healthy Father     Social History   Tobacco Use  . Smoking status: Never Smoker  . Smokeless tobacco: Never Used  Substance Use Topics  . Alcohol use: Yes    Alcohol/week: 4.0 standard drinks    Types: 4 Cans of beer per week    Comment: occasionally  . Drug use: No  employed  Home Medications Prior to Admission medications   Medication Sig Start Date End Date Taking? Authorizing Provider  amoxicillin (AMOXIL) 500 MG capsule Take 1 capsule (500 mg total) by mouth 3 (three) times daily. 07/20/20   Devoria Albe, MD  predniSONE (DELTASONE) 10 MG tablet Take 2  tablets (20 mg total) by mouth daily. 07/05/20   Avegno, Zachery Dakins, FNP  fluticasone (FLONASE) 50 MCG/ACT nasal spray Place 1 spray into both nostrils daily for 14 days. 04/01/20 04/18/20  Durward Parcel, FNP    Allergies    Patient has no known allergies.  Review of Systems   Review of Systems  All other systems reviewed and are negative.   Physical Exam Updated Vital Signs BP 128/72 (BP Location: Right Arm)   Pulse (!) 57   Temp 98.3 F (36.8 C) (Oral)   Resp 15   Ht 5\' 11"  (1.803 m)   Wt 82.6 kg   SpO2 100%   BMI 25.38 kg/m   Physical Exam Vitals and nursing note reviewed.  Constitutional:      Appearance: Normal appearance. He is normal weight.     Comments: Patient coughed once during our interview  HENT:     Head: Normocephalic and atraumatic.     Right Ear: External ear normal.     Left Ear: External ear normal.  Eyes:     Extraocular Movements: Extraocular movements intact.     Conjunctiva/sclera: Conjunctivae normal.  Cardiovascular:     Rate and Rhythm: Regular rhythm. Bradycardia present.  Pulses: Normal pulses.     Heart sounds: Normal heart sounds.  Pulmonary:     Effort: Pulmonary effort is normal. No respiratory distress.     Breath sounds: Normal breath sounds. No wheezing or rales.  Musculoskeletal:        General: Normal range of motion.     Cervical back: Normal range of motion.  Skin:    General: Skin is warm and dry.  Neurological:     General: No focal deficit present.     Mental Status: He is alert and oriented to person, place, and time.     Cranial Nerves: No cranial nerve deficit.  Psychiatric:        Mood and Affect: Mood normal.        Behavior: Behavior normal.        Thought Content: Thought content normal.     ED Results / Procedures / Treatments   Labs (all labs ordered are listed, but only abnormal results are displayed) Labs Reviewed - No data to display  EKG None  Radiology No results  found.  Procedures Procedures (including critical care time)  Medications Ordered in ED Medications - No data to display  ED Course  I have reviewed the triage vital signs and the nursing notes.  Pertinent labs & imaging results that were available during my care of the patient were reviewed by me and considered in my medical decision making (see chart for details).    MDM Rules/Calculators/A&P                          Reviewing patient's chart he was seen August 1 at [facility] and prescribed [employer] and a Z-Pak.  He then was seen at [facility] urgent care on August 10, he had a negative Covid test that day.  He was prescribed prednisone 20 mg a day for 7 days.  He had a EKG done at that time.  He then had a visit at [facility] urgent care in [place] on August 12 and was told to stop the prednisone and was started on omeprazole.  He was diagnosed with GERD after having had a EKG done.  It showed sinus bradycardia.   Patient presents with persistent cough without any concerning symptoms such as shortness of breath, fever, or chest pain.  He seems to get his care from place to place instead of going to one place consistently so he would get consistent care.  He was put on amoxicillin.  Final Clinical Impression(s) / ED Diagnoses Final diagnoses:  Bronchitis    Rx / DC Orders ED Discharge Orders         Ordered    amoxicillin (AMOXIL) 500 MG capsule  3 times daily        07/20/20 0428         Plan discharge  Devoria Albe, MD, Concha Pyo, MD 07/20/20 [phone]

## 2020-07-29 ENCOUNTER — Ambulatory Visit: Payer: No Typology Code available for payment source | Admitting: Cardiology

## 2020-10-20 ENCOUNTER — Emergency Department (HOSPITAL_COMMUNITY)
Admission: EM | Admit: 2020-10-20 | Discharge: 2020-10-20 | Disposition: A | Discharge status: Home / Self Care | Payer: HRSA Program | Attending: Emergency Medicine | Admitting: Emergency Medicine

## 2020-10-20 ENCOUNTER — Encounter (HOSPITAL_COMMUNITY): Payer: Self-pay | Admitting: Obstetrics and Gynecology

## 2020-10-20 ENCOUNTER — Other Ambulatory Visit: Payer: Self-pay

## 2020-10-20 DIAGNOSIS — U071 COVID-19: Secondary | ICD-10-CM | POA: Insufficient documentation

## 2020-10-20 DIAGNOSIS — R059 Cough, unspecified: Secondary | ICD-10-CM | POA: Diagnosis present

## 2020-10-20 MED ORDER — ONDANSETRON 4 MG PO TBDP
4.0000 mg | ORAL_TABLET | Freq: Three times a day (TID) | ORAL | 0 refills | Status: DC | PRN
Start: 2020-10-20 — End: 2020-10-22

## 2020-10-20 MED ORDER — ONDANSETRON 4 MG PO TBDP
4.0000 mg | ORAL_TABLET | Freq: Once | ORAL | Status: AC
  Administered 2020-10-20: 4 mg via ORAL
  Filled 2020-10-20: qty 1

## 2020-10-20 MED ORDER — BENZONATATE 100 MG PO CAPS
100.0000 mg | ORAL_CAPSULE | Freq: Once | ORAL | Status: AC
  Administered 2020-10-20: 100 mg via ORAL
  Filled 2020-10-20: qty 1

## 2020-10-20 MED ORDER — BENZONATATE 100 MG PO CAPS
100.0000 mg | ORAL_CAPSULE | Freq: Three times a day (TID) | ORAL | 0 refills | Status: DC
Start: 2020-10-20 — End: 2020-10-20

## 2020-10-20 MED ORDER — IBUPROFEN 800 MG PO TABS
800.0000 mg | ORAL_TABLET | Freq: Once | ORAL | Status: AC
  Administered 2020-10-20: 800 mg via ORAL
  Filled 2020-10-20: qty 1

## 2020-10-20 MED ORDER — IBUPROFEN 600 MG PO TABS
600.0000 mg | ORAL_TABLET | Freq: Four times a day (QID) | ORAL | 0 refills | Status: AC | PRN
Start: 2020-10-20 — End: ?

## 2020-10-20 MED ORDER — ONDANSETRON 4 MG PO TBDP
4.0000 mg | ORAL_TABLET | Freq: Three times a day (TID) | ORAL | 0 refills | Status: DC | PRN
Start: 2020-10-20 — End: 2020-10-20

## 2020-10-20 MED ORDER — BENZONATATE 100 MG PO CAPS: 100.0000 mg | ORAL_CAPSULE | Freq: Three times a day (TID) | ORAL | 0 refills | Status: AC

## 2020-10-20 MED ORDER — IBUPROFEN 600 MG PO TABS
600.0000 mg | ORAL_TABLET | Freq: Four times a day (QID) | ORAL | 0 refills | Status: DC | PRN
Start: 2020-10-20 — End: 2020-10-20

## 2020-10-20 NOTE — ED Notes (Signed)
Patient is requesting prescriptions be sent to different pharmacy.

## 2020-10-20 NOTE — ED Notes (Signed)
96% on RA with ambulation.

## 2020-10-20 NOTE — ED Provider Notes (Signed)
[facility]-EMERGENCY DEPT Provider Note   CSN: [id number] Arrival date & time: 10/20/20  1021     History Chief Complaint  Patient presents with  . Covid Positive  . Fatigue    Bradley Dorsey is a 24 y.o. male with a past medical history significant for GERD who presents to the ED due to nausea, vomiting, persistent cough, and headaches.  Patient tested positive for Covid on Monday and has been having persistent symptoms since.  Patient notes symptom onset started roughly 1 week ago with frequent headaches and myalgias.  Patient notes his headache is located in the frontal region and intermittent in nature.  Denies visual changes, speech changes, and unilateral weakness.  Denies head injury.  He also admits to a dry cough that is worse in the morning and evenings.  He describes it as a "tickle in his throat".  He also admits to numerous episodes of nonbloody, nonbilious emesis only after eating. Denies fever, chills, chest pain, and shortness of breath.  Denies lower extremity edema, history of blood clots, recent surgeries, recent long immobilizations, trauma, and history of cancer. He has been taking OTC medications with moderate relief.   History obtained from patient and past medical records. No interpreter used during encounter.      Past Medical History:  Diagnosis Date  . GERD (gastroesophageal reflux disease)     There are no problems to display for this patient.   History reviewed. No pertinent surgical history.     Family History  Problem Relation Age of Onset  . Healthy Mother   . Healthy Father     Social History   Tobacco Use  . Smoking status: Never Smoker  . Smokeless tobacco: Never Used  Substance Use Topics  . Alcohol use: Yes    Alcohol/week: 4.0 standard drinks    Types: 4 Cans of beer per week    Comment: occasionally  . Drug use: No    Home Medications Prior to Admission medications   Medication Sig Start Date End Date  Taking? Authorizing Provider  acetaminophen (TYLENOL) 500 MG tablet Take 1,000 mg by mouth every 6 (six) hours as needed for mild pain or headache.    Yes [provider]  Multiple Vitamin (MULTIVITAMIN WITH MINERALS) TABS tablet Take 1 tablet by mouth daily.   Yes [provider]  benzonatate (TESSALON) 100 MG capsule Take 1 capsule (100 mg total) by mouth every 8 (eight) hours. 10/20/20   Mannie Stabile, PA-C  ibuprofen (ADVIL) 600 MG tablet Take 1 tablet (600 mg total) by mouth every 6 (six) hours as needed. 10/20/20   Mannie Stabile, PA-C  ondansetron (ZOFRAN ODT) 4 MG disintegrating tablet Take 1 tablet (4 mg total) by mouth every 8 (eight) hours as needed for nausea or vomiting. 10/20/20   Mannie Stabile, PA-C  fluticasone (FLONASE) 50 MCG/ACT nasal spray Place 1 spray into both nostrils daily for 14 days. 04/01/20 04/18/20  Durward Parcel, FNP    Allergies    Amoxicillin  Review of Systems   Review of Systems  Constitutional: Negative for chills and fever.  HENT: Negative for congestion, rhinorrhea and sore throat.   Respiratory: Positive for cough. Negative for shortness of breath.   Cardiovascular: Negative for chest pain and leg swelling.  Gastrointestinal: Positive for diarrhea, nausea and vomiting. Negative for abdominal pain.  Musculoskeletal: Negative for back pain.  Neurological: Positive for headaches. Negative for dizziness, speech difficulty and numbness.  All  other systems reviewed and are negative.   Physical Exam Updated Vital Signs BP 117/77   Pulse 86   Temp 97.8 F (36.6 C) (Oral)   Resp 16   Ht 5\' 11"  (1.803 m)   Wt 83.9 kg   SpO2 96%   BMI 25.80 kg/m   Physical Exam Vitals and nursing note reviewed.  Constitutional:      General: He is not in acute distress.    Appearance: He is not ill-appearing.     Comments: Resting comfortably in bed  HENT:     Head: Normocephalic.  Eyes:     Pupils: Pupils are equal, round,  and reactive to light.  Cardiovascular:     Rate and Rhythm: Normal rate and regular rhythm.     Pulses: Normal pulses.     Heart sounds: Normal heart sounds. No murmur heard.  No friction rub. No gallop.   Pulmonary:     Effort: Pulmonary effort is normal.     Breath sounds: Normal breath sounds.     Comments: Respirations equal and unlabored, patient able to speak in full sentences, lungs clear to auscultation bilaterally Abdominal:     General: Abdomen is flat. Bowel sounds are normal. There is no distension.     Palpations: Abdomen is soft.     Tenderness: There is no abdominal tenderness. There is no guarding or rebound.     Comments: Abdomen soft, nondistended, nontender to palpation in all quadrants without guarding or peritoneal signs. No rebound.   Musculoskeletal:     Cervical back: Neck supple.     Comments: No lower extremity edema. Negative homan sign bilaterally  Skin:    General: Skin is warm and dry.  Neurological:     General: No focal deficit present.     Mental Status: He is alert.  Psychiatric:        Mood and Affect: Mood normal.        Behavior: Behavior normal.     ED Results / Procedures / Treatments   Labs (all labs ordered are listed, but only abnormal results are displayed) Labs Reviewed - No data to display  EKG None  Radiology No results found.  Procedures Procedures (including critical care time)  Medications Ordered in ED Medications  ondansetron (ZOFRAN-ODT) disintegrating tablet 4 mg (4 mg Oral Given 10/20/20 1058)  benzonatate (TESSALON) capsule 100 mg (100 mg Oral Given 10/20/20 1058)  ibuprofen (ADVIL) tablet 800 mg (800 mg Oral Given 10/20/20 1058)    ED Course  I have reviewed the triage vital signs and the nursing notes.  Pertinent labs & imaging results that were available during my care of the patient were reviewed by me and considered in my medical decision making (see chart for details).  Clinical Course as of Oct 20 1232  Thu Oct 20, 2020  1031 Pulse Rate(!): 103 [CA]  1031 SpO2: 97 % [CA]  1215 Temp: 97.8 F (36.6 C) [CA]    Clinical Course User Index [CA] Oct 22, 2020, PA-C   MDM Rules/Calculators/A&P                         24 year old male presents to the ED due to COVID symptoms. He tested positive for COVID on Monday and has been experiencing headaches, cough, nausea, vomiting, and diarrhea for the past few days. Denies chest pain and shortness of breath. Stable vitals upon arrival. Patient noted to be mildly tachycardic upon  arrival. During my initial evaluation, patient's HR in the upper 90s. Patient in no acute distress and non-toxic appearing. Physical exam reassuring. Lungs clear to auscultation bilaterally. No wheeze, rales, or rhonchi. Doubt pneumonia. Abdomen soft, non-distended, and non-tender. Low suspicion for acute abdomen. No clinical signs of DVT on exam. Low suspicion for PE/DVT. Suspect tachycardia related to dehydration. Oral hydration here in the ED. Shared decision making in regards to labs vs. Symptomatic treatment and patient would prefer just symptomatic treatment which I find to be reasonable given all of his symptoms are most likely related to COVID infection. Zofran, Tessalon, and ibuprofen given. Will reassess for symptomatic relief.   12:17 PM upon reassessment, patient admits to improvement in symptoms. HR improved throughout ED stay with oral hydration. Patient ambulated here in the ED and maintained O2 saturation at 96% without difficulty. Will discharge with symptomatic treatment. Instructed patient to follow-up with PCP in a week if symptoms do not improve. COVID quarantine guidelines discussed with patient. Patient able to tolerate po without difficulty while here in the ED. Strict ED precautions discussed with patient. Patient states understanding and agrees to plan. Patient discharged home in no acute distress and stable vitals  BLONG BUSK was evaluated in  Emergency Department on 10/20/2020 for the symptoms described in the history of present illness. He was evaluated in the context of the global COVID-19 pandemic, which necessitated consideration that the patient might be at risk for infection with the SARS-CoV-2 virus that causes COVID-19. Institutional protocols and algorithms that pertain to the evaluation of patients at risk for COVID-19 are in a state of rapid change based on information released by regulatory bodies including the CDC and federal and state organizations. These policies and algorithms were followed during the patient's care in the ED.  Discussed case with Dr. Rosalia Hammers who agrees with assessment and plan. Final Clinical Impression(s) / ED Diagnoses Final diagnoses:  COVID-19 virus infection    Rx / DC Orders ED Discharge Orders         Ordered    ondansetron (ZOFRAN ODT) 4 MG disintegrating tablet  Every 8 hours PRN        10/20/20 1114    benzonatate (TESSALON) 100 MG capsule  Every 8 hours        10/20/20 1114    ibuprofen (ADVIL) 600 MG tablet  Every 6 hours PRN        10/20/20 1114           Mannie Stabile, PA-C 10/20/20 1238    Margarita Grizzle, MD 10/20/20 1402

## 2020-10-20 NOTE — Discharge Instructions (Addendum)
As discussed, your symptoms are most likely related to COVID infection. I am sending you home with cough medication, ibuprofen, and nausea medication. Take as prescribed. Your symptoms should begin to improve over the next few days. Follow-up with PCP if symptoms do not improve within a week. You must quarantine away from others for 10 days since symptoms onset. Return to the ER if you develop chest pain, shortness of breath, or worsening of symptoms. Continue to drink a lot of fluids.

## 2020-10-20 NOTE — ED Triage Notes (Signed)
Patient presents to the ED for c/o "COVID problems" Patient reports he had a positive test on Monday. Patient denies vaccination.  Patient reports he has a cough, headaches, emesis, diarrhea.

## 2020-10-22 ENCOUNTER — Other Ambulatory Visit: Payer: Self-pay

## 2020-10-22 ENCOUNTER — Emergency Department (HOSPITAL_COMMUNITY)
Admission: EM | Admit: 2020-10-22 | Discharge: 2020-10-22 | Disposition: A | Discharge status: Home / Self Care | Payer: HRSA Program | Attending: Emergency Medicine | Admitting: Emergency Medicine

## 2020-10-22 ENCOUNTER — Encounter (HOSPITAL_COMMUNITY): Payer: Self-pay

## 2020-10-22 ENCOUNTER — Emergency Department (HOSPITAL_COMMUNITY): Payer: HRSA Program

## 2020-10-22 DIAGNOSIS — U071 COVID-19: Secondary | ICD-10-CM | POA: Diagnosis not present

## 2020-10-22 DIAGNOSIS — R197 Diarrhea, unspecified: Secondary | ICD-10-CM | POA: Diagnosis not present

## 2020-10-22 DIAGNOSIS — R059 Cough, unspecified: Secondary | ICD-10-CM | POA: Insufficient documentation

## 2020-10-22 DIAGNOSIS — R112 Nausea with vomiting, unspecified: Secondary | ICD-10-CM | POA: Diagnosis not present

## 2020-10-22 DIAGNOSIS — M791 Myalgia, unspecified site: Secondary | ICD-10-CM | POA: Insufficient documentation

## 2020-10-22 DIAGNOSIS — R079 Chest pain, unspecified: Secondary | ICD-10-CM | POA: Insufficient documentation

## 2020-10-22 DIAGNOSIS — R0602 Shortness of breath: Secondary | ICD-10-CM | POA: Diagnosis present

## 2020-10-22 MED ORDER — ONDANSETRON 4 MG PO TBDP: 4.0000 mg | ORAL_TABLET | Freq: Three times a day (TID) | ORAL | 0 refills | Status: AC | PRN

## 2020-10-22 NOTE — ED Triage Notes (Signed)
Pt c/o being covid + this past Monday. Pt stating he is having "covid complications" such as lack of appetite, nausea, and a cough.

## 2020-10-22 NOTE — ED Provider Notes (Signed)
[facility]-EMERGENCY DEPT Provider Note   CSN: [id number] Arrival date & time: 10/22/20  0531     History Chief Complaint  Patient presents with  . Shortness of Breath    Bradley Dorsey is a 24 y.o. male.  Patient presents to the emergency department with chief complaint of chest pain.  He reports that he was recently diagnosed with Covid-19.  He states that he has associated cough, nausea, vomiting, diarrhea, and body aches.  He has tried managing his symptoms with cough medicine, DayQuil, NyQuil, and ibuprofen with little relief at home.  He denies any other medical problems.  The history is provided by the patient. No language interpreter was used.       Past Medical History:  Diagnosis Date  . GERD (gastroesophageal reflux disease)     There are no problems to display for this patient.   History reviewed. No pertinent surgical history.     Family History  Problem Relation Age of Onset  . Healthy Mother   . Healthy Father     Social History   Tobacco Use  . Smoking status: Never Smoker  . Smokeless tobacco: Never Used  Substance Use Topics  . Alcohol use: Yes    Alcohol/week: 4.0 standard drinks    Types: 4 Cans of beer per week    Comment: occasionally  . Drug use: No    Home Medications Prior to Admission medications   Medication Sig Start Date End Date Taking? Authorizing Provider  acetaminophen (TYLENOL) 500 MG tablet Take 1,000 mg by mouth every 6 (six) hours as needed for mild pain or headache.     [provider]  benzonatate (TESSALON) 100 MG capsule Take 1 capsule (100 mg total) by mouth every 8 (eight) hours. 10/20/20   Mannie Stabile, PA-C  ibuprofen (ADVIL) 600 MG tablet Take 1 tablet (600 mg total) by mouth every 6 (six) hours as needed. 10/20/20   Mannie Stabile, PA-C  Multiple Vitamin (MULTIVITAMIN WITH MINERALS) TABS tablet Take 1 tablet by mouth daily.    [provider]  ondansetron  (ZOFRAN ODT) 4 MG disintegrating tablet Take 1 tablet (4 mg total) by mouth every 8 (eight) hours as needed for nausea or vomiting. 10/20/20   Mannie Stabile, PA-C  fluticasone (FLONASE) 50 MCG/ACT nasal spray Place 1 spray into both nostrils daily for 14 days. 04/01/20 04/18/20  Durward Parcel, FNP    Allergies    Amoxicillin  Review of Systems   Review of Systems  All other systems reviewed and are negative.   Physical Exam Updated Vital Signs BP 126/80 (BP Location: Left Arm)   Pulse 90   Temp 98.5 F (36.9 C) (Oral)   Resp 19   Ht 5\' 11"  (1.803 m)   Wt 83.9 kg   SpO2 94%   BMI 25.80 kg/m   Physical Exam Vitals and nursing note reviewed.  Constitutional:      Appearance: He is well-developed.  HENT:     Head: Normocephalic and atraumatic.  Eyes:     Conjunctiva/sclera: Conjunctivae normal.  Cardiovascular:     Rate and Rhythm: Normal rate and regular rhythm.     Heart sounds: No murmur heard.   Pulmonary:     Effort: Pulmonary effort is normal. No respiratory distress.     Breath sounds: Normal breath sounds.  Abdominal:     Palpations: Abdomen is soft.     Tenderness: There is no abdominal tenderness.  Musculoskeletal:        General: Normal range of motion.     Cervical back: Neck supple.  Skin:    General: Skin is warm and dry.  Neurological:     Mental Status: He is alert and oriented to person, place, and time.  Psychiatric:        Mood and Affect: Mood normal.        Behavior: Behavior normal.     ED Results / Procedures / Treatments   Labs (all labs ordered are listed, but only abnormal results are displayed) Labs Reviewed - No data to display  EKG None  Radiology DG Chest [facility] 1 View  Result Date: 10/22/2020 CLINICAL DATA:  Chest pain.  COVID positive past Monday. EXAM: PORTABLE CHEST 1 VIEW COMPARISON:  06/26/2020. FINDINGS: The heart size and mediastinal contours are within normal limits. Subtle hazy opacities in the right midlung  and left lung base. No visible pleural effusions or pneumothorax. No acute osseous abnormality. IMPRESSION: Subtle hazy opacities in the right midlung and left lung base, concerning for pneumonia given reported COVID diagnosis. Electronically Signed   By: Feliberto Harts MD   On: 10/22/2020 06:12    Procedures Procedures (including critical care time)  Medications Ordered in ED Medications - No data to display  ED Course  I have reviewed the triage vital signs and the nursing notes.  Pertinent labs & imaging results that were available during my care of the patient were reviewed by me and considered in my medical decision making (see chart for details).    MDM Rules/Calculators/A&P                         [name] was evaluated in Emergency Department on 10/22/2020 for the symptoms described in the history of present illness. He was evaluated in the context of the global COVID-19 pandemic, which necessitated consideration that the patient might be at risk for infection with the SARS-CoV-2 virus that causes COVID-19. Institutional protocols and algorithms that pertain to the evaluation of patients at risk for COVID-19 are in a state of rapid change based on information released by regulatory bodies including the CDC and federal and state organizations. These policies and algorithms were followed during the patient's care in the ED.  Patient is here with symptoms consistent with his Covid-19 infection.  Given that he is now complaining of chest pain, will check EKG and chest x-ray to look for any signs of pericarditis or pericardial effusion, though I think this is unlikely in this otherwise healthy patient.  He is not hypotensive, he is not tachycardic, his O2 saturation is greater than 90%.  6:06 AM EKG reviewed with Dr. Pilar Plate, no diffuse ST elevations or depressions to suggest pericarditis.  CXR shows subtle hazy opacities in right midlung and left lung base concerning for pneumonia  and consistent with Covid diagnosis. Final Clinical Impression(s) / ED Diagnoses Final diagnoses:  COVID-19    Rx / DC Orders ED Discharge Orders         Ordered    ondansetron (ZOFRAN ODT) 4 MG disintegrating tablet  Every 8 hours PRN        10/22/20 0622           Roxy Horseman, PA-C 10/22/20 5176    Sabas Sous, MD 10/22/20 [phone]

## 2020-10-22 NOTE — Discharge Instructions (Signed)
You need to isolate at home for 10 days from your positive test result.  Take Nyquil/Dayquil for symptom control.    Return for new or worsening symptoms.

## 2020-12-31 ENCOUNTER — Emergency Department (HOSPITAL_COMMUNITY): Payer: Self-pay

## 2020-12-31 ENCOUNTER — Other Ambulatory Visit: Payer: Self-pay

## 2020-12-31 ENCOUNTER — Encounter (HOSPITAL_COMMUNITY): Payer: Self-pay

## 2020-12-31 DIAGNOSIS — Z8616 Personal history of COVID-19: Secondary | ICD-10-CM | POA: Insufficient documentation

## 2020-12-31 DIAGNOSIS — R0789 Other chest pain: Secondary | ICD-10-CM | POA: Insufficient documentation

## 2020-12-31 DIAGNOSIS — R944 Abnormal results of kidney function studies: Secondary | ICD-10-CM | POA: Insufficient documentation

## 2020-12-31 NOTE — ED Triage Notes (Signed)
Pt reports intermittent left sided chest pain that returned tonight while watching a movie. Pain has been off and on for 2 days.

## 2021-01-01 ENCOUNTER — Emergency Department (HOSPITAL_COMMUNITY)
Admission: EM | Admit: 2021-01-01 | Discharge: 2021-01-01 | Disposition: A | Discharge status: Home / Self Care | Payer: Self-pay | Attending: Emergency Medicine | Admitting: Emergency Medicine

## 2021-01-01 DIAGNOSIS — R0789 Other chest pain: Secondary | ICD-10-CM

## 2021-01-01 DIAGNOSIS — R7989 Other specified abnormal findings of blood chemistry: Secondary | ICD-10-CM

## 2021-01-01 LAB — BASIC METABOLIC PANEL
Anion gap: 10 (ref 5–15)
BUN: 19 mg/dL (ref 6–20)
CO2: 26 mmol/L (ref 22–32)
Calcium: 9.6 mg/dL (ref 8.9–10.3)
Chloride: 101 mmol/L (ref 98–111)
Creatinine, Ser: 1.43 mg/dL — ABNORMAL HIGH (ref 0.61–1.24)
GFR, Estimated: >60 mL/min (ref >60)
Glucose, Bld: 79 mg/dL (ref 70–99)
Potassium: 4.2 mmol/L (ref 3.5–5.1)
Sodium: 137 mmol/L (ref 135–145)

## 2021-01-01 LAB — CBC
HCT: 45.6 % (ref 39.0–52.0)
Hemoglobin: 15.4 g/dL (ref 13.0–17.0)
MCH: 30.3 pg (ref 26.0–34.0)
MCHC: 33.8 g/dL (ref 30.0–36.0)
MCV: 89.8 fL (ref 80.0–100.0)
Platelets: 241 10*3/uL (ref 150–400)
RBC: 5.08 MIL/uL (ref 4.22–5.81)
RDW: 12.3 % (ref 11.5–15.5)
WBC: 7.2 10*3/uL (ref 4.0–10.5)
nRBC: 0 % (ref 0.0–0.2)

## 2021-01-01 LAB — TROPONIN I (HIGH SENSITIVITY)
Troponin I (High Sensitivity): 5 ng/L (ref <18)
Troponin I (High Sensitivity): 9 ng/L (ref <18)

## 2021-01-01 MED ORDER — DEXAMETHASONE 4 MG PO TABS
10.0000 mg | ORAL_TABLET | Freq: Once | ORAL | Status: AC
  Administered 2021-01-01: 10 mg via ORAL
  Filled 2021-01-01: qty 2

## 2021-01-01 NOTE — ED Provider Notes (Signed)
[facility]-EMERGENCY DEPT Provider Note   CSN: [id number] Arrival date & time: 12/31/20  2251   History Chief Complaint  Patient presents with  . Chest Pain    Bradley Dorsey is a 25 y.o. male.  The history is provided by the patient.  Chest Pain He has history of GERD and comes in with several day history of intermittent left-sided chest pain.  He describes a vague, dull sensation which will last a few minutes before resolving.  There is no associated dyspnea or diaphoresis but there has been occasional nausea.  When the pain is present, nothing seems to make it better and nothing makes it worse.  He is a non-smoker and denies history of hypertension or diabetes or hyperlipidemia and there is no family history of premature coronary atherosclerosis.  Of note, he was diagnosed with COVID-19 in November.  There has been no recent travel and no recent surgery.  Past Medical History:  Diagnosis Date  . GERD (gastroesophageal reflux disease)     There are no problems to display for this patient.   History reviewed. No pertinent surgical history.     Family History  Problem Relation Age of Onset  . Healthy Mother   . Healthy Father     Social History   Tobacco Use  . Smoking status: Never Smoker  . Smokeless tobacco: Never Used  Substance Use Topics  . Alcohol use: Yes    Alcohol/week: 4.0 standard drinks    Types: 4 Cans of beer per week    Comment: occasionally  . Drug use: No    Home Medications Prior to Admission medications   Medication Sig Start Date End Date Taking? Authorizing Provider  acetaminophen (TYLENOL) 500 MG tablet Take 1,000 mg by mouth every 6 (six) hours as needed for mild pain or headache.     [provider]  benzonatate (TESSALON) 100 MG capsule Take 1 capsule (100 mg total) by mouth every 8 (eight) hours. 10/20/20   Mannie Stabile, PA-C  ibuprofen (ADVIL) 600 MG tablet Take 1 tablet (600 mg total) by mouth every  6 (six) hours as needed. 10/20/20   Mannie Stabile, PA-C  Multiple Vitamin (MULTIVITAMIN WITH MINERALS) TABS tablet Take 1 tablet by mouth daily.    [provider]  ondansetron (ZOFRAN ODT) 4 MG disintegrating tablet Take 1 tablet (4 mg total) by mouth every 8 (eight) hours as needed for nausea or vomiting. 10/22/20   Roxy Horseman, PA-C  fluticasone (FLONASE) 50 MCG/ACT nasal spray Place 1 spray into both nostrils daily for 14 days. 04/01/20 04/18/20  Durward Parcel, FNP    Allergies    Amoxicillin  Review of Systems   Review of Systems  Cardiovascular: Positive for chest pain.  All other systems reviewed and are negative.   Physical Exam Updated Vital Signs BP 125/72 (BP Location: Right Arm)   Pulse (!) 54   Temp 98.5 F (36.9 C) (Oral)   Resp 16   Ht 5\' 11"  (1.803 m)   Wt 83.9 kg   SpO2 99%   BMI 25.80 kg/m   Physical Exam Vitals and nursing note reviewed.   25 year old male, resting comfortably and in no acute distress. Vital signs are normal. Oxygen saturation is 99%, which is normal. Head is normocephalic and atraumatic. PERRLA, EOMI. Oropharynx is clear. Neck is nontender and supple without adenopathy or JVD. Back is nontender and there is no CVA tenderness. Lungs are clear without rales,  wheezes, or rhonchi. Chest is nontender. Heart has regular rate and rhythm without murmur. Abdomen is soft, flat, nontender without masses or hepatosplenomegaly and peristalsis is normoactive. Extremities have no cyanosis or edema, full range of motion is present. Skin is warm and dry without rash. Neurologic: Mental status is normal, cranial nerves are intact, there are no motor or sensory deficits.  ED Results / Procedures / Treatments   Labs (all labs ordered are listed, but only abnormal results are displayed) Labs Reviewed  BASIC METABOLIC PANEL - Abnormal; Notable for the following components:      Result Value   Creatinine, Ser 1.43 (*)    All other  components within normal limits  CBC  TROPONIN I (HIGH SENSITIVITY)  TROPONIN I (HIGH SENSITIVITY)    EKG EKG Interpretation  Date/Time:  Saturday December 31 2020 23:17:59 EST Ventricular Rate:  67 PR Interval:    QRS Duration: 86 QT Interval:  391 QTC Calculation: 413 R Axis:   51 Text Interpretation: Sinus rhythm ST elev, probable normal early repol pattern When compared with ECG of 10/22/2020, No significant change was found Confirmed by Dione Booze ([id number]) on 12/31/2020 11:21:25 PM   Radiology DG Chest 2 View  Result Date: 12/31/2020 CLINICAL DATA:  Left side chest pain EXAM: CHEST - 2 VIEW COMPARISON:  10/22/2020 FINDINGS: The heart size and mediastinal contours are within normal limits. Both lungs are clear. The visualized skeletal structures are unremarkable. IMPRESSION: Normal study. Electronically Signed   By: Charlett Nose M.D.   On: 12/31/2020 23:54    Procedures Procedures   Medications Ordered in ED Medications  dexamethasone (DECADRON) tablet 10 mg (has no administration in time range)    ED Course  I have reviewed the triage vital signs and the nursing notes.  Pertinent labs & imaging results that were available during my care of the patient were reviewed by me and considered in my medical decision making (see chart for details).  MDM Rules/Calculators/A&P Atypical chest pain of uncertain cause.  Pain is quite atypical for coronary artery disease.  No risk factors for pulmonary embolism.  He is greater than 6 weeks following COVID-19, so is no longer at risk for Covid related thrombosis.  No cough or fever to suggest pneumonia.  ECG shows changes of early repolarization and is unchanged from prior.  Chest x-ray is normal.  Troponin is normal with repeat troponin pending.  Remainder of labs are significant for mildly elevated creatinine.  Creatinine had been normal in October 2020.  When I explained this to him and the need to avoid NSAIDs, patient does admit that  he has been taking a lot of naproxen for headaches.  Old records are reviewed confirming COVID-19 diagnosis on 10/20/2020.  He is given a dose of dexamethasone and advised to stop using NSAIDs and have his creatinine rechecked in 1 month.  Repeat troponin is normal and he is discharged.  Final Clinical Impression(s) / ED Diagnoses Final diagnoses:  Atypical chest pain  Elevated serum creatinine    Rx / DC Orders ED Discharge Orders    None       Dione Booze, MD 01/01/21 [phone]

## 2021-01-01 NOTE — Discharge Instructions (Addendum)
All of your tests to evaluate your chest pain did not show any sign of any serious problem.  You may take acetaminophen as needed for pain.  If you feel you are having problems with GERD, take omeprazole (Prilosec OTC) once a day.  Your creatinine level was slightly high today.  Creatinine is a test of your kidney.  You should have this test repeated in 2-4 weeks to make sure that it is not getting worse.  In the meantime, do not take any NSAIDs.  NSAIDs include medications like ibuprofen and naproxen.  They can worsen your kidney function.

## 2021-03-13 ENCOUNTER — Other Ambulatory Visit: Payer: Self-pay

## 2021-03-13 ENCOUNTER — Emergency Department (HOSPITAL_COMMUNITY)
Admission: EM | Admit: 2021-03-13 | Discharge: 2021-03-13 | Disposition: A | Discharge status: Home / Self Care | Payer: Self-pay | Attending: Emergency Medicine | Admitting: Emergency Medicine

## 2021-03-13 ENCOUNTER — Encounter (HOSPITAL_COMMUNITY): Payer: Self-pay

## 2021-03-13 DIAGNOSIS — N4889 Other specified disorders of penis: Secondary | ICD-10-CM | POA: Insufficient documentation

## 2021-03-13 LAB — URINALYSIS, ROUTINE W REFLEX MICROSCOPIC
Bacteria, UA: NONE SEEN
Bilirubin Urine: NEGATIVE
Glucose, UA: NEGATIVE mg/dL
Hgb urine dipstick: NEGATIVE
Ketones, ur: NEGATIVE mg/dL
Nitrite: NEGATIVE
Protein, ur: NEGATIVE mg/dL
Specific Gravity, Urine: 1.012 (ref 1.005–1.030)
pH: 5 (ref 5.0–8.0)

## 2021-03-13 MED ORDER — CEFTRIAXONE SODIUM 1 G IJ SOLR
500.0000 mg | Freq: Once | INTRAMUSCULAR | Status: AC
  Administered 2021-03-13: 500 mg via INTRAMUSCULAR
  Filled 2021-03-13: qty 10

## 2021-03-13 MED ORDER — STERILE WATER FOR INJECTION IJ SOLN
INTRAMUSCULAR | Status: AC
  Filled 2021-03-13: qty 10

## 2021-03-13 MED ORDER — DOXYCYCLINE HYCLATE 100 MG PO CAPS: 100.0000 mg | ORAL_CAPSULE | Freq: Two times a day (BID) | ORAL | 0 refills | Status: AC

## 2021-03-13 MED ORDER — DOXYCYCLINE HYCLATE 100 MG PO TABS
100.0000 mg | ORAL_TABLET | Freq: Once | ORAL | Status: AC
  Administered 2021-03-13: 100 mg via ORAL
  Filled 2021-03-13: qty 1

## 2021-03-13 NOTE — ED Notes (Signed)
An After Visit Summary was printed and given to the patient. Discharge instructions given and no further questions at this time.  

## 2021-03-13 NOTE — Discharge Instructions (Addendum)
Please pick up your prescription and take as prescribed to cover for possible chlamydia infection.  We have tested you for gonorrhea, chlamydia, HIV, syphilis and will call you in 2 to 3 days time if any of these test positive.  You can also log into MyChart and check your results that way.  It is recommended that you refrain from intercourse until you finish the entire course of antibiotics to treat your infection.  If you do end of testing positive you will need to have all your partners tested and treated as well.  Your urine did have a small amount of bacteria in it.  We will send it for culture and see if it grows anything consistent with a urinary tract infection however this is very atypical for males to have.  It is likely that you have some bacteria in your urine likely related to an STD.  Please follow-up with your PCP regarding your ED visit today.  Return to the ED for any worsening symptoms.

## 2021-03-13 NOTE — ED Triage Notes (Signed)
Pt sts burning at the tip of his penis. Sometimes while urinating but usually at rest. Pain has been constant for 2-3 days.

## 2021-03-13 NOTE — ED Provider Notes (Signed)
[facility]-EMERGENCY DEPT Provider Note   CSN: [id number] Arrival date & time: 03/13/21  1858     History Chief Complaint  Patient presents with  . Dysuria    Bradley Dorsey is a 25 y.o. male who presents to the ED today with complaint of gradual onset, constant, penile irritation to the head of his penis for the past 2 days.  She reports the pain comes and goes and is not always present just with urination.  He denies any penile discharge, testicular pain/swelling.  He is sexually active with one male partner however they do not use protection.  He reports history of chlamydia in the past and states this feels similar.  He has no other complaints at this time including fevers, chills, abdominal pain, nausea, vomiting.   The history is provided by the patient and medical records.       Past Medical History:  Diagnosis Date  . GERD (gastroesophageal reflux disease)     There are no problems to display for this patient.   History reviewed. No pertinent surgical history.     Family History  Problem Relation Age of Onset  . Healthy Mother   . Healthy Father     Social History   Tobacco Use  . Smoking status: Never Smoker  . Smokeless tobacco: Never Used  Substance Use Topics  . Alcohol use: Yes    Alcohol/week: 4.0 standard drinks    Types: 4 Cans of beer per week    Comment: occasionally  . Drug use: No    Home Medications Prior to Admission medications   Medication Sig Start Date End Date Taking? Authorizing Provider  doxycycline (VIBRAMYCIN) 100 MG capsule Take 1 capsule (100 mg total) by mouth 2 (two) times daily for 7 days. 03/13/21 03/20/21 Yes Kohlton Gilpatrick, PA-C  acetaminophen (TYLENOL) 500 MG tablet Take 1,000 mg by mouth every 6 (six) hours as needed for mild pain or headache.     [provider]  benzonatate (TESSALON) 100 MG capsule Take 1 capsule (100 mg total) by mouth every 8 (eight) hours. 10/20/20   Mannie Stabile, PA-C  ibuprofen (ADVIL) 600 MG tablet Take 1 tablet (600 mg total) by mouth every 6 (six) hours as needed. 10/20/20   Mannie Stabile, PA-C  Multiple Vitamin (MULTIVITAMIN WITH MINERALS) TABS tablet Take 1 tablet by mouth daily.    [provider]  ondansetron (ZOFRAN ODT) 4 MG disintegrating tablet Take 1 tablet (4 mg total) by mouth every 8 (eight) hours as needed for nausea or vomiting. 10/22/20   Roxy Horseman, PA-C  fluticasone (FLONASE) 50 MCG/ACT nasal spray Place 1 spray into both nostrils daily for 14 days. 04/01/20 04/18/20  Durward Parcel, FNP    Allergies    Amoxicillin  Review of Systems   Review of Systems  Constitutional: Negative for chills and fever.  Gastrointestinal: Negative for abdominal pain, nausea and vomiting.  Genitourinary: Positive for penile pain. Negative for dysuria.  All other systems reviewed and are negative.   Physical Exam Updated Vital Signs BP 126/79 (BP Location: Left Arm)   Pulse 63   Temp 98.3 F (36.8 C) (Oral)   Resp 18   Ht 5\' 11"  (1.803 m)   Wt 83.9 kg   SpO2 99%   BMI 25.80 kg/m   Physical Exam Vitals and nursing note reviewed.  Constitutional:      Appearance: He is not ill-appearing.  HENT:     Head:  Normocephalic and atraumatic.  Eyes:     Conjunctiva/sclera: Conjunctivae normal.  Cardiovascular:     Rate and Rhythm: Normal rate and regular rhythm.     Pulses: Normal pulses.  Pulmonary:     Effort: Pulmonary effort is normal.     Breath sounds: Normal breath sounds. No wheezing, rhonchi or rales.  Genitourinary:    Comments: Chaperone present for exam Circumcised penis without phimosis/paraphimosis, hypospadias, erythema, tenderness, or discharge. No rashes or lesions. Testes with no masses or tenderness, no swelling, and cremasterics reflex present bilaterally. No abnormal lie. No inguinal hernias or adenopathy present. Skin:    General: Skin is warm and dry.     Coloration: Skin is not jaundiced.   Neurological:     Mental Status: He is alert.     ED Results / Procedures / Treatments   Labs (all labs ordered are listed, but only abnormal results are displayed) Labs Reviewed  URINALYSIS, ROUTINE W REFLEX MICROSCOPIC - Abnormal; Notable for the following components:      Result Value   Leukocytes,Ua TRACE (*)    All other components within normal limits  URINE CULTURE  RPR  HIV ANTIBODY (ROUTINE TESTING W REFLEX)  GC/CHLAMYDIA PROBE AMP ([place]) NOT AT [facility]    EKG None  Radiology No results found.  Procedures Procedures   Medications Ordered in ED Medications  sterile water (preservative free) injection (has no administration in time range)  cefTRIAXone (ROCEPHIN) injection 500 mg (500 mg Intramuscular Given 03/13/21 2031)  doxycycline (VIBRA-TABS) tablet 100 mg (100 mg Oral Given 03/13/21 2031)    ED Course  I have reviewed the triage vital signs and the nursing notes.  Pertinent labs & imaging results that were available during my care of the patient were reviewed by me and considered in my medical decision making (see chart for details).    MDM Rules/Calculators/A&P                          25 year old male presents to the ED today complaining of penile irritation for the past 2 days mostly at the tip of his penis.  Sexually active however does not use protection.  On arrival to the ED vitals are stable.  Patient appears to be no acute distress.  Chaperone present for GU exam.  No signs of discharge, testicular pain/swelling.  We will plan fo urinalysis at this time as well as GC chlamydia via urine sample.  Patient would like HIV and syphilis testing at this time.  Given he is having symptoms will plan to treat with Rocephin and doxy at this time.  Will discharge home with prescription for Doxy.  Patient instructed to wait until he hears about his test results and not have intercourse until that time.  If positive he will need to have all partners tested and  treated.  He is in agreement with plan at this time  U/A with trace leuks and 11-20 WBCs. Will send for culture however less suspicious for UTI at this time.  Patient to be 5 Doxy to cover for chlamydia.  He is stable for discharge at this time.   This note was prepared using Dragon voice recognition software and may include unintentional dictation errors due to the inherent limitations of voice recognition software.  Final Clinical Impression(s) / ED Diagnoses Final diagnoses:  Penile pain    Rx / DC Orders ED Discharge Orders  Ordered    doxycycline (VIBRAMYCIN) 100 MG capsule  2 times daily        03/13/21 2203           Discharge Instructions     Please pick up your prescription and take as prescribed to cover for possible chlamydia infection.  We have tested you for gonorrhea, chlamydia, HIV, syphilis and will call you in 2 to 3 days time if any of these test positive.  You can also log into MyChart and check your results that way.  It is recommended that you refrain from intercourse until you finish the entire course of antibiotics to treat your infection.  If you do end of testing positive you will need to have all your partners tested and treated as well.  Your urine did have a small amount of bacteria in it.  We will send it for culture and see if it grows anything consistent with a urinary tract infection however this is very atypical for males to have.  It is likely that you have some bacteria in your urine likely related to an STD.  Please follow-up with your PCP regarding your ED visit today.  Return to the ED for any worsening symptoms.       Tanda Rockers, PA-C 03/13/21 2205    Linwood Dibbles, MD 03/13/21 2352

## 2021-03-14 LAB — HIV ANTIBODY (ROUTINE TESTING W REFLEX): HIV Screen 4th Generation wRfx: NONREACTIVE

## 2021-03-14 LAB — GC/CHLAMYDIA PROBE AMP (~~LOC~~) NOT AT ARMC
Chlamydia: NEGATIVE
Comment: NEGATIVE
Comment: NORMAL
Neisseria Gonorrhea: NEGATIVE

## 2021-03-14 LAB — RPR: RPR Ser Ql: NONREACTIVE

## 2021-04-02 ENCOUNTER — Emergency Department (HOSPITAL_COMMUNITY)
Admission: EM | Admit: 2021-04-02 | Discharge: 2021-04-02 | Disposition: A | Discharge status: Home / Self Care | Payer: Self-pay | Attending: Emergency Medicine | Admitting: Emergency Medicine

## 2021-04-02 ENCOUNTER — Other Ambulatory Visit: Payer: Self-pay

## 2021-04-02 ENCOUNTER — Emergency Department (HOSPITAL_COMMUNITY): Payer: Self-pay

## 2021-04-02 DIAGNOSIS — R002 Palpitations: Secondary | ICD-10-CM | POA: Insufficient documentation

## 2021-04-02 DIAGNOSIS — R112 Nausea with vomiting, unspecified: Secondary | ICD-10-CM | POA: Insufficient documentation

## 2021-04-02 DIAGNOSIS — R42 Dizziness and giddiness: Secondary | ICD-10-CM | POA: Insufficient documentation

## 2021-04-02 NOTE — Discharge Instructions (Addendum)
Call your primary care doctor or specialist as discussed in the next 2-3 days.   Return immediately back to the ER if:  Your symptoms worsen within the next 12-24 hours. You develop new symptoms such as new fevers, persistent vomiting, new pain, shortness of breath, or new weakness or numbness, or if you have any other concerns.  

## 2021-04-02 NOTE — ED Provider Notes (Signed)
[facility]-EMERGENCY DEPT Provider Note   CSN: [id number] Arrival date & time: 04/02/21  2104     History Chief Complaint  Patient presents with  . Palpitations    Bradley Dorsey is a 25 y.o. male.  Patient presents episode of palpitations rapid heart rate and nausea and vomiting today.  Patient was taking shower when he felt lightheaded and had his episode.  He had a similar episode a couple years ago and had seen cardiology had an echocardiogram done that was unremarkable.  He states symptoms have currently resolved and he feels fine last about 10 or 15 minutes today while he was in the shower.  Denies loss of consciousness fall or pain elsewhere.        Past Medical History:  Diagnosis Date  . GERD (gastroesophageal reflux disease)     There are no problems to display for this patient.   No past surgical history on file.     Family History  Problem Relation Age of Onset  . Healthy Mother   . Healthy Father     Social History   Tobacco Use  . Smoking status: Never Smoker  . Smokeless tobacco: Never Used  Substance Use Topics  . Alcohol use: Yes    Alcohol/week: 4.0 standard drinks    Types: 4 Cans of beer per week    Comment: occasionally  . Drug use: No    Home Medications Prior to Admission medications   Medication Sig Start Date End Date Taking? Authorizing Provider  acetaminophen (TYLENOL) 500 MG tablet Take 1,000 mg by mouth every 6 (six) hours as needed for mild pain or headache.    Yes [provider]  Multiple Vitamin (MULTIVITAMIN WITH MINERALS) TABS tablet Take 1 tablet by mouth daily.   Yes [provider]  omeprazole (PRILOSEC) 20 MG capsule Take 20 mg by mouth daily as needed (heartburn).   Yes [provider]  benzonatate (TESSALON) 100 MG capsule Take 1 capsule (100 mg total) by mouth every 8 (eight) hours. Patient not taking: No sig reported 10/20/20   Mannie Stabile, PA-C  ibuprofen  (ADVIL) 600 MG tablet Take 1 tablet (600 mg total) by mouth every 6 (six) hours as needed. Patient not taking: No sig reported 10/20/20   Claudette Stapler C, PA-C  ondansetron (ZOFRAN ODT) 4 MG disintegrating tablet Take 1 tablet (4 mg total) by mouth every 8 (eight) hours as needed for nausea or vomiting. Patient not taking: No sig reported 10/22/20   Roxy Horseman, PA-C  fluticasone [facility]) 50 MCG/ACT nasal spray Place 1 spray into both nostrils daily for 14 days. 04/01/20 04/18/20  Durward Parcel, FNP    Allergies    Amoxicillin  Review of Systems   Review of Systems  Constitutional: Negative for fever.  HENT: Negative for ear pain and sore throat.   Eyes: Negative for pain.  Respiratory: Negative for cough.   Cardiovascular: Positive for chest pain and palpitations.  Gastrointestinal: Negative for abdominal pain.  Genitourinary: Negative for flank pain.  Musculoskeletal: Negative for back pain.  Skin: Negative for color change and rash.  Neurological: Negative for syncope.  All other systems reviewed and are negative.   Physical Exam Updated Vital Signs BP 127/85 (BP Location: Right Arm)   Pulse 62   Temp 98.3 F (36.8 C) (Oral)   Resp (!) 21   Ht 5\' 11"  (1.803 m)   Wt 84.8 kg   SpO2 100%   BMI 26.08  kg/m   Physical Exam Constitutional:      General: He is not in acute distress.    Appearance: He is well-developed.  HENT:     Head: Normocephalic.     Nose: Nose normal.  Eyes:     Extraocular Movements: Extraocular movements intact.  Cardiovascular:     Rate and Rhythm: Normal rate.  Pulmonary:     Effort: Pulmonary effort is normal.  Skin:    Coloration: Skin is not jaundiced.  Neurological:     Mental Status: He is alert. Mental status is at baseline.     ED Results / Procedures / Treatments   Labs (all labs ordered are listed, but only abnormal results are displayed) Labs Reviewed - No data to display  EKG None  Radiology No results  found.  Procedures Procedures   Medications Ordered in ED Medications - No data to display  ED Course  I have reviewed the triage vital signs and the nursing notes.  Pertinent labs & imaging results that were available during my care of the patient were reviewed by me and considered in my medical decision making (see chart for details).    MDM Rules/Calculators/A&P                          EKG shows sinus rhythm normal rate.  No ST elevations depressions noted.  Chest x-ray is unremarkable no acute pathology noted patient remains asymptomatic at this time.  I advised him to return to his cardiologist for possible Holter monitor or Zio patch evaluation.  Advised me to return for recurrent symptoms worsening symptoms trouble breathing or any additional concerns.  Final Clinical Impression(s) / ED Diagnoses Final diagnoses:  Palpitations    Rx / DC Orders ED Discharge Orders    None       Cheryll Cockayne, MD 04/02/21 2220

## 2021-04-02 NOTE — ED Triage Notes (Signed)
Pt came in with c/o palpitations. Pt states that he was in the shower and his heart started to beat rapidly, he got dizzy, and then threw up. Pt denies chest pain at this time, but he endorses dizziness

## 2021-12-25 IMAGING — DX DG CHEST 1V PORT
1 series · 1 of 1 positions shown · non-contrast
Comparison: December 31, 2020

CLINICAL DATA: Palpitations.

EXAM:
PORTABLE CHEST 1 VIEW

[chest ap]
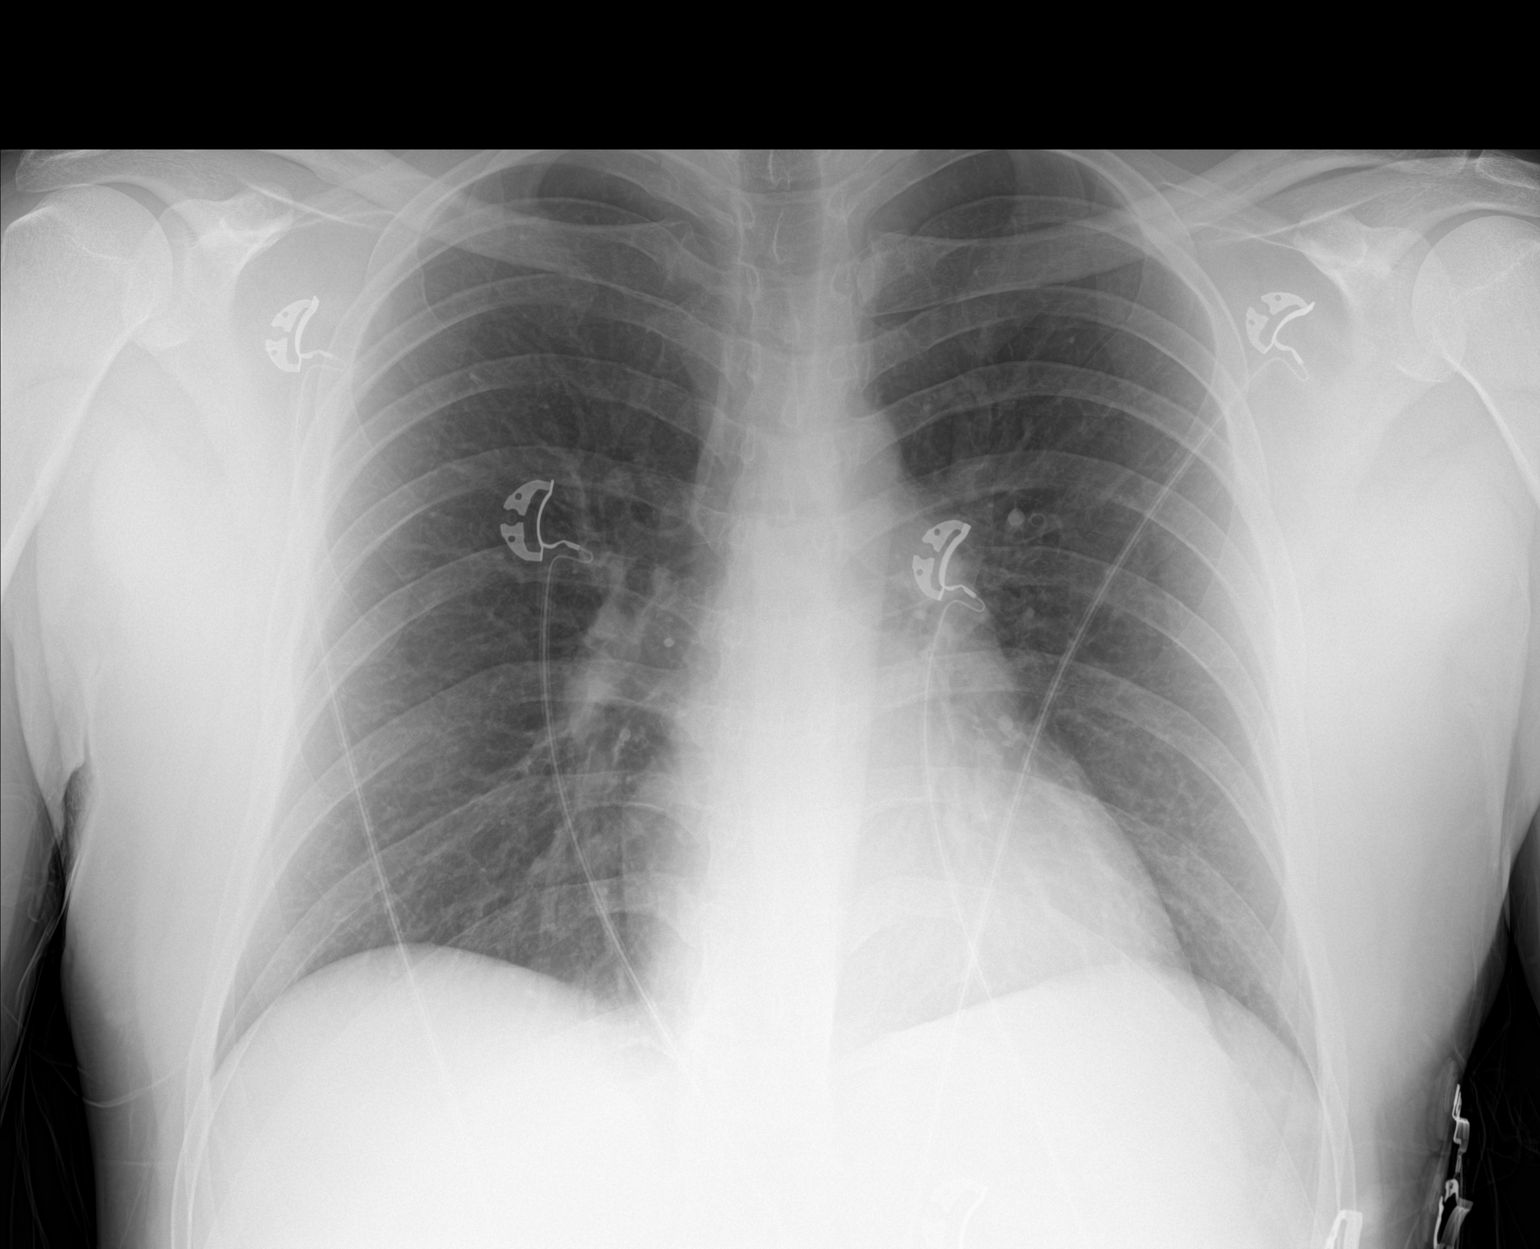

[1 of 1 positions shown; findings below may reference images not displayed]

FINDINGS: The heart size and mediastinal contours are within normal limits.
Both lungs are clear. The visualized skeletal structures are
unremarkable.
IMPRESSION: No active disease.

## 2022-11-27 ENCOUNTER — Emergency Department (HOSPITAL_COMMUNITY)
Admission: EM | Admit: 2022-11-27 | Discharge: 2022-11-27 | Discharge status: Against Medical Advice | Payer: No Typology Code available for payment source

## 2022-11-27 NOTE — ED Notes (Signed)
Pt called, noa nswer from [facility]

## 2022-12-06 ENCOUNTER — Emergency Department (HOSPITAL_COMMUNITY)
Admission: EM | Admit: 2022-12-06 | Discharge: 2022-12-06 | Disposition: A | Discharge status: Home / Self Care | Payer: No Typology Code available for payment source | Attending: Student | Admitting: Student

## 2022-12-06 ENCOUNTER — Other Ambulatory Visit: Payer: Self-pay

## 2022-12-06 ENCOUNTER — Emergency Department (HOSPITAL_COMMUNITY): Payer: No Typology Code available for payment source

## 2022-12-06 ENCOUNTER — Encounter (HOSPITAL_COMMUNITY): Payer: Self-pay | Admitting: *Deleted

## 2022-12-06 DIAGNOSIS — R197 Diarrhea, unspecified: Secondary | ICD-10-CM

## 2022-12-06 DIAGNOSIS — R42 Dizziness and giddiness: Secondary | ICD-10-CM | POA: Insufficient documentation

## 2022-12-06 DIAGNOSIS — Z1152 Encounter for screening for COVID-19: Secondary | ICD-10-CM | POA: Insufficient documentation

## 2022-12-06 DIAGNOSIS — R109 Unspecified abdominal pain: Secondary | ICD-10-CM | POA: Insufficient documentation

## 2022-12-06 LAB — CBC
HCT: 46 % (ref 39.0–52.0)
Hemoglobin: 15.8 g/dL (ref 13.0–17.0)
MCH: 30 pg (ref 26.0–34.0)
MCHC: 34.3 g/dL (ref 30.0–36.0)
MCV: 87.3 fL (ref 80.0–100.0)
Platelets: 242 10*3/uL (ref 150–400)
RBC: 5.27 MIL/uL (ref 4.22–5.81)
RDW: 12 % (ref 11.5–15.5)
WBC: 5.9 10*3/uL (ref 4.0–10.5)
nRBC: 0 % (ref 0.0–0.2)

## 2022-12-06 LAB — COMPREHENSIVE METABOLIC PANEL
ALT: 17 U/L (ref 0–44)
AST: 19 U/L (ref 15–41)
Albumin: 4.9 g/dL (ref 3.5–5.0)
Alkaline Phosphatase: 68 U/L (ref 38–126)
Anion gap: 8 (ref 5–15)
BUN: 11 mg/dL (ref 6–20)
CO2: 24 mmol/L (ref 22–32)
Calcium: 9.3 mg/dL (ref 8.9–10.3)
Chloride: 106 mmol/L (ref 98–111)
Creatinine, Ser: 1.05 mg/dL (ref 0.61–1.24)
GFR, Estimated: >60 mL/min (ref >60)
Glucose, Bld: 109 mg/dL — ABNORMAL HIGH (ref 70–99)
Potassium: 3.6 mmol/L (ref 3.5–5.1)
Sodium: 138 mmol/L (ref 135–145)
Total Bilirubin: 0.9 mg/dL (ref 0.3–1.2)
Total Protein: 7.8 g/dL (ref 6.5–8.1)

## 2022-12-06 LAB — RESP PANEL BY RT-PCR (RSV, FLU A&B, COVID)  RVPGX2
Influenza A by PCR: NEGATIVE
Influenza B by PCR: NEGATIVE
Resp Syncytial Virus by PCR: NEGATIVE
SARS Coronavirus 2 by RT PCR: NEGATIVE

## 2022-12-06 LAB — SEDIMENTATION RATE: Sed Rate: 0 mm/hr (ref 0–16)

## 2022-12-06 LAB — C-REACTIVE PROTEIN: CRP: <0.5 mg/dL (ref <1.0)

## 2022-12-06 LAB — TROPONIN I (HIGH SENSITIVITY): Troponin I (High Sensitivity): <2 ng/L (ref <18)

## 2022-12-06 LAB — LIPASE, BLOOD: Lipase: 38 U/L (ref 11–51)

## 2022-12-06 MED ORDER — LOPERAMIDE HCL 2 MG PO CAPS: 2.0000 mg | ORAL_CAPSULE | Freq: Two times a day (BID) | ORAL | 0 refills | Status: AC

## 2022-12-06 MED ORDER — IOHEXOL 300 MG/ML  SOLN
100.0000 mL | Freq: Once | INTRAMUSCULAR | Status: AC | PRN
  Administered 2022-12-06: 100 mL via INTRAVENOUS

## 2022-12-06 MED ORDER — LACTATED RINGERS IV BOLUS
1000.0000 mL | Freq: Once | INTRAVENOUS | Status: AC
  Administered 2022-12-06: 1000 mL via INTRAVENOUS

## 2022-12-06 NOTE — ED Triage Notes (Signed)
Pt with abd pain x 12 days per pt with diarrhea.  C/o dizzy with positional changes. Pt also with c/o left sided CP.

## 2022-12-06 NOTE — ED Provider Notes (Signed)
[facility] EMERGENCY DEPARTMENT Provider Note  CSN: [id number] Arrival date & time: 12/06/22 9563  Chief Complaint(s) Abdominal Pain  HPI Bradley Dorsey is a 27 y.o. male with PMH GERD who presents emergency department for evaluation of persistent diarrhea.  Patient states that for the last 12 days he has had persistent watery diarrhea.  He has been seen 4 times at different emergency departments and at the [facility] for this and has followed up with his gastroenterologist who is following stool studies that at this time of the negative.  He states that in the morning he gets very lightheaded and feels like he might pass out but the symptoms improve as he drinks water throughout the day.  Diarrhea is watery and nonbloody.  He is currently trialing Bentyl with minimal relief.  Denies chest pain, shortness of breath, headache, fever or other systemic symptoms.  Patient is very worried that he may be developing colon cancer and is requesting additional CT imaging today.   Past Medical History Past Medical History:  Diagnosis Date   GERD (gastroesophageal reflux disease)    There are no problems to display for this patient.  Home Medication(s) Prior to Admission medications   Medication Sig Start Date End Date Taking? Authorizing Provider  acetaminophen (TYLENOL) 500 MG tablet Take 1,000 mg by mouth every 6 (six) hours as needed for mild pain or headache.    Yes [provider]  loperamide (IMODIUM) 2 MG capsule Take 1 capsule (2 mg total) by mouth every 12 (twelve) hours. 12/06/22  Yes Shi Grose, MD  benzonatate (TESSALON) 100 MG capsule Take 1 capsule (100 mg total) by mouth every 8 (eight) hours. Patient not taking: Reported on 04/02/2021 10/20/20   Suzy Bouchard, PA-C  ibuprofen (ADVIL) 600 MG tablet Take 1 tablet (600 mg total) by mouth every 6 (six) hours as needed. Patient not taking: Reported on 04/02/2021 10/20/20   Charmaine Downs C, PA-C  ondansetron (ZOFRAN ODT) 4 MG  disintegrating tablet Take 1 tablet (4 mg total) by mouth every 8 (eight) hours as needed for nausea or vomiting. Patient not taking: Reported on 04/02/2021 10/22/20   Montine Circle, PA-C  sulfamethoxazole-trimethoprim (BACTRIM DS) 800-160 MG tablet Take 1 tablet by mouth 2 (two) times daily. Patient not taking: Reported on 12/06/2022 11/23/22   [provider]  fluticasone (FLONASE) 50 MCG/ACT nasal spray Place 1 spray into both nostrils daily for 14 days. 04/01/20 04/18/20  Emerson Monte, FNP                                                                                                                                    Past Surgical History History reviewed. No pertinent surgical history. Family History Family History  Problem Relation Age of Onset   Healthy Mother    Healthy Father     Social History Social History   Tobacco Use   Smoking status: Never  Smokeless tobacco: Never  Substance Use Topics   Alcohol use: Yes    Alcohol/week: 4.0 standard drinks of alcohol    Types: 4 Cans of beer per week    Comment: occasionally   Drug use: No   Allergies Amoxicillin  Review of Systems Review of Systems  Gastrointestinal:  Positive for diarrhea.  Neurological:  Positive for light-headedness.    Physical Exam Vital Signs  I have reviewed the triage vital signs BP (!) 135/96   Pulse (!) 57   Temp 98.9 F (37.2 C) (Temporal)   Resp (!) 23   Ht 5\' 11"  (1.803 m)   Wt 89.4 kg   SpO2 97%   BMI 27.48 kg/m   Physical Exam Vitals and nursing note reviewed.  Constitutional:      General: He is not in acute distress.    Appearance: He is well-developed.  HENT:     Head: Normocephalic and atraumatic.  Eyes:     Conjunctiva/sclera: Conjunctivae normal.  Cardiovascular:     Rate and Rhythm: Normal rate and regular rhythm.     Heart sounds: No murmur heard. Pulmonary:     Effort: Pulmonary effort is normal. No respiratory distress.  Musculoskeletal:         General: No swelling.     Cervical back: Neck supple.  Skin:    General: Skin is warm and dry.  Neurological:     Mental Status: He is alert.  Psychiatric:        Mood and Affect: Mood normal.    ED Results and Treatments Labs (all labs ordered are listed, but only abnormal results are displayed) Labs Reviewed  COMPREHENSIVE METABOLIC PANEL - Abnormal; Notable for the following components:      Result Value   Glucose, Bld 109 (*)    All other components within normal limits  RESP PANEL BY RT-PCR (RSV, FLU A&B, COVID)  RVPGX2  GASTROINTESTINAL PANEL BY PCR, STOOL (REPLACES STOOL CULTURE)  LIPASE, BLOOD  CBC  SEDIMENTATION RATE  C-REACTIVE PROTEIN  TROPONIN I (HIGH SENSITIVITY)                                                                                                                          Radiology CT ABDOMEN PELVIS W CONTRAST  Result Date: 12/06/2022 CLINICAL DATA:  Acute left lower quadrant abdominal pain. EXAM: CT ABDOMEN AND PELVIS WITH CONTRAST TECHNIQUE: Multidetector CT imaging of the abdomen and pelvis was performed using the standard protocol following bolus administration of intravenous contrast. RADIATION DOSE REDUCTION: This exam was performed according to the departmental dose-optimization program which includes automated exposure control, adjustment of the mA and/or kV according to patient size and/or use of iterative reconstruction technique. CONTRAST:  177mL OMNIPAQUE IOHEXOL 300 MG/ML  SOLN COMPARISON:  None Available. FINDINGS: Lower chest: No acute abnormality. Hepatobiliary: No focal liver abnormality is seen. No gallstones, gallbladder wall thickening, or biliary dilatation. Pancreas: Unremarkable. No pancreatic ductal dilatation or surrounding inflammatory changes. Spleen:  Normal in size without focal abnormality. Adrenals/Urinary Tract: Adrenal glands are unremarkable. Kidneys are normal, without renal calculi, focal lesion, or hydronephrosis. Bladder is  unremarkable. Stomach/Bowel: Stomach is within normal limits. Appendix appears normal. No evidence of bowel wall thickening, distention, or inflammatory changes. Vascular/Lymphatic: No significant vascular findings are present. No enlarged abdominal or pelvic lymph nodes. Reproductive: Prostate is unremarkable. Other: No abdominal wall hernia or abnormality. No abdominopelvic ascites. Musculoskeletal: No acute or significant osseous findings. IMPRESSION: No definite abnormality seen in the abdomen or pelvis. Electronically Signed   By: Lupita Raider M.D.   On: 12/06/2022 13:03   DG Chest 2 View  Result Date: 12/06/2022 CLINICAL DATA:  Chest pain EXAM: CHEST - 2 VIEW COMPARISON:  11/22/2022 FINDINGS: The heart size and mediastinal contours are within normal limits. Both lungs are clear. The visualized skeletal structures are unremarkable. IMPRESSION: No active cardiopulmonary disease. Electronically Signed   By: Duanne Guess D.O.   On: 12/06/2022 13:03    Pertinent labs & imaging results that were available during my care of the patient were reviewed by me and considered in my medical decision making (see MDM for details).  Medications Ordered in ED Medications  lactated ringers bolus 1,000 mL (0 mLs Intravenous Stopped 12/06/22 1300)  iohexol (OMNIPAQUE) 300 MG/ML solution 100 mL (100 mLs Intravenous Contrast Given 12/06/22 1243)                                                                                                                                     Procedures Procedures  (including critical care time)  Medical Decision Making / ED Course   This patient presents to the ED for concern of diarrhea, this involves an extensive number of treatment options, and is a complaint that carries with it a high risk of complications and morbidity.  The differential diagnosis includes IBS, functional diarrhea, secretory diarrhea, infectious diarrhea, orthostatic presyncope,  dehydration  MDM: Patient to the emergency department for evaluation of persistent diarrhea and lightheadedness.  Physical exam is largely unremarkable.  Extensive laboratory evaluation obtained including high-sensitivity troponin testing for lightheadedness, negative inflammatory markers and at patient's request CT imaging which was reassuringly negative as well.  Patient fluid resuscitated and on reevaluation his lightheadedness has improved.  We had a very long discussion about appropriate hydration and tempering expectations that he likely will feel more lightheaded in the morning than he would throughout the day if he continues to have diarrhea.  We will trial scheduled loperamide and he will need to follow-up outpatient with his gastroenterologist.  He currently does not meet inpatient criteria for admission he is safe for discharge with outpatient follow-up.   Additional history obtained:  -External records from outside source obtained and reviewed including: Chart review including previous notes, labs, imaging, consultation notes   Lab Tests: -I ordered, reviewed, and interpreted labs.   The pertinent results include:   Labs Reviewed  COMPREHENSIVE METABOLIC PANEL - Abnormal; Notable for the following components:      Result Value   Glucose, Bld 109 (*)    All other components within normal limits  RESP PANEL BY RT-PCR (RSV, FLU A&B, COVID)  RVPGX2  GASTROINTESTINAL PANEL BY PCR, STOOL (REPLACES STOOL CULTURE)  LIPASE, BLOOD  CBC  SEDIMENTATION RATE  C-REACTIVE PROTEIN  TROPONIN I (HIGH SENSITIVITY)      EKG   EKG Interpretation  Date/Time:  Thursday December 06 2022 10:59:33 EST Ventricular Rate:  74 PR Interval:  152 QRS Duration: 82 QT Interval:  382 QTC Calculation: 424 R Axis:   75 Text Interpretation: Normal sinus rhythm with sinus arrhythmia Abnormal ECG When compared with ECG of 02-Apr-2021 21:24, PREVIOUS ECG IS PRESENT Confirmed by Aritza Brunet ([id number]) on  12/06/2022 9:22:42 PM         Imaging Studies ordered: I ordered imaging studies including CTAP, chest x-ray I independently visualized and interpreted imaging. I agree with the radiologist interpretation   Medicines ordered and prescription drug management: Meds ordered this encounter  Medications   lactated ringers bolus 1,000 mL   iohexol (OMNIPAQUE) 300 MG/ML solution 100 mL   loperamide (IMODIUM) 2 MG capsule    Sig: Take 1 capsule (2 mg total) by mouth every 12 (twelve) hours.    Dispense:  60 capsule    Refill:  0    -I have reviewed the patients home medicines and have made adjustments as needed  Critical interventions none    Cardiac Monitoring: The patient was maintained on a cardiac monitor.  I personally viewed and interpreted the cardiac monitored which showed an underlying rhythm of: NSR  Social Determinants of Health:  Factors impacting patients care include: none   Reevaluation: After the interventions noted above, I reevaluated the patient and found that they have :improved  Co morbidities that complicate the patient evaluation  Past Medical History:  Diagnosis Date   GERD (gastroesophageal reflux disease)       Dispostion: I considered admission for this patient, but at this time he does not meet inpatient criteria for admission and he is safe for outpatient follow-up     Final Clinical Impression(s) / ED Diagnoses Final diagnoses:  Diarrhea, unspecified type     @PCDICTATION @    , MD 12/06/22 2123

## 2022-12-19 ENCOUNTER — Emergency Department (HOSPITAL_COMMUNITY)
Admission: EM | Admit: 2022-12-19 | Discharge: 2022-12-19 | Disposition: A | Discharge status: Home / Self Care | Payer: No Typology Code available for payment source | Attending: Emergency Medicine | Admitting: Emergency Medicine

## 2022-12-19 ENCOUNTER — Encounter (HOSPITAL_COMMUNITY): Payer: Self-pay | Admitting: *Deleted

## 2022-12-19 ENCOUNTER — Emergency Department (HOSPITAL_COMMUNITY): Payer: No Typology Code available for payment source

## 2022-12-19 ENCOUNTER — Other Ambulatory Visit: Payer: Self-pay

## 2022-12-19 DIAGNOSIS — R42 Dizziness and giddiness: Secondary | ICD-10-CM | POA: Insufficient documentation

## 2022-12-19 LAB — TROPONIN I (HIGH SENSITIVITY)
Troponin I (High Sensitivity): <2 ng/L (ref <18)
Troponin I (High Sensitivity): <2 ng/L (ref <18)

## 2022-12-19 LAB — CBC
HCT: 46.1 % (ref 39.0–52.0)
Hemoglobin: 15.7 g/dL (ref 13.0–17.0)
MCH: 30 pg (ref 26.0–34.0)
MCHC: 34.1 g/dL (ref 30.0–36.0)
MCV: 88 fL (ref 80.0–100.0)
Platelets: 239 10*3/uL (ref 150–400)
RBC: 5.24 MIL/uL (ref 4.22–5.81)
RDW: 12.1 % (ref 11.5–15.5)
WBC: 6.9 10*3/uL (ref 4.0–10.5)
nRBC: 0 % (ref 0.0–0.2)

## 2022-12-19 LAB — BASIC METABOLIC PANEL
Anion gap: 10 (ref 5–15)
BUN: 12 mg/dL (ref 6–20)
CO2: 25 mmol/L (ref 22–32)
Calcium: 9.6 mg/dL (ref 8.9–10.3)
Chloride: 99 mmol/L (ref 98–111)
Creatinine, Ser: 1 mg/dL (ref 0.61–1.24)
GFR, Estimated: >60 mL/min (ref >60)
Glucose, Bld: 107 mg/dL — ABNORMAL HIGH (ref 70–99)
Potassium: 3.8 mmol/L (ref 3.5–5.1)
Sodium: 134 mmol/L — ABNORMAL LOW (ref 135–145)
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
## 2022-12-19 NOTE — ED Triage Notes (Signed)
Pt is here for CP which feels like pressure and has been intermittent for 3 days.  Pt also has had intermittent lightheadedness (has holter monitor in place due to this).  Pt also has intermittent HA

## 2022-12-19 NOTE — Discharge Instructions (Signed)
As discussed, your evaluation today has been largely reassuring.  But, it is important that you monitor your condition carefully, and do not hesitate to return to the ED if you develop new, or concerning changes in your condition. ? ?Otherwise, please follow-up with your physician for appropriate ongoing care. ? ?

## 2023-06-03 ENCOUNTER — Ambulatory Visit (HOSPITAL_COMMUNITY): Payer: No Typology Code available for payment source | Admitting: Clinical

## 2023-06-03 ENCOUNTER — Encounter (HOSPITAL_COMMUNITY): Payer: Self-pay

## 2023-06-03 DIAGNOSIS — F4310 Post-traumatic stress disorder, unspecified: Secondary | ICD-10-CM | POA: Diagnosis not present

## 2023-06-03 DIAGNOSIS — F419 Anxiety disorder, unspecified: Secondary | ICD-10-CM | POA: Diagnosis not present

## 2023-06-03 DIAGNOSIS — F331 Major depressive disorder, recurrent, moderate: Secondary | ICD-10-CM

## 2023-06-03 NOTE — Progress Notes (Signed)
Virtual Visit via Video Note  I connected with Bradley Dorsey on 06/03/23 at  1:00 PM EDT by a video enabled telemedicine application and verified that I am speaking with the correct person using two identifiers.  Location: Patient: home Provider: office   I discussed the limitations of evaluation and management by telemedicine and the availability of in person appointments. The patient expressed understanding and agreed to proceed.   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 No data recorded Referral name: No data recorded Referral phone number: No data recorded  Whom do you see for routine medical problems? No data recorded Practice/Facility Name: No data recorded Practice/Facility Phone Number: No data recorded Name of Contact: No data recorded Contact Number: No data recorded Contact Fax Number: No data recorded Prescriber Name: No data recorded Prescriber Address (if known): No data recorded  What Is the Reason for Your Visit/Call Today? No data recorded How Long Has This Been Causing You Problems? No data recorded What Do You Feel Would Help You the Most Today? No data recorded  Have You Recently Been in Any Inpatient Treatment (Hospital/Detox/Crisis Center/28-Day Program)? No data recorded Name/Location of Program/Hospital:No data recorded How Long Were You There? No data recorded When Were You Discharged? No data recorded  Have You Ever Received Services From [facility] Before? No data recorded Who Do You See at [facility]? No data recorded  Have You Recently Had Any Thoughts About Hurting Yourself? No data recorded Are You Planning to Commit Suicide/Harm Yourself At This time? No data recorded  Have you  Recently Had Thoughts About Hurting Someone Karolee Ohs? No data recorded Explanation: No data recorded  Have You Used Any Alcohol or Drugs in the Past 24 Hours? No data recorded How Long Ago Did You Use Drugs or Alcohol? No data recorded What Did You Use and How Much? No data recorded  Do You Currently Have a Therapist/Psychiatrist? No data recorded Name of Therapist/Psychiatrist: No data recorded  Have You Been Recently Discharged From Any Office Practice or Programs? No data recorded Explanation of Discharge From Practice/Program: No data recorded    CCA Screening Triage Referral Assessment Type of Contact: No data recorded Is this Initial or Reassessment? No data recorded Date Telepsych consult ordered in [facility]:  No data recorded Time Telepsych consult ordered in [facility]:  No data recorded  Patient Reported Information Reviewed? No data recorded Patient Left Without Being Seen? No data recorded Reason for Not Completing Assessment: No data recorded  Collateral Involvement: No data recorded  Does Patient Have a Court Appointed Legal Guardian? No data recorded Name and Contact of Legal Guardian: No data recorded If Minor and Not Living with Parent(s), Who has Custody? No data recorded Is CPS involved or ever been involved? No data recorded Is APS involved or ever been involved? No data recorded  Patient Determined To Be At Risk for Harm To Self or Others Based on Review of Patient Reported Information or Presenting Complaint? No data recorded Method: No data recorded Availability of Means: No data recorded Intent: No data recorded Notification Required: No data recorded Additional Information for Danger to Others Potential: No data recorded Additional Comments for Danger to Others Potential: No data recorded Are There Guns or Other Weapons in Your Home? No  data recorded Types of Guns/Weapons: No data recorded Are These Weapons Safely Secured?                            No data  recorded Who Could Verify You Are Able To Have These Secured: No data recorded Do You Have any Outstanding Charges, Pending Court Dates, Parole/Probation? No data recorded Contacted To Inform of Risk of Harm To Self or Others: No data recorded  Location of Assessment: No data recorded  Does Patient Present under Involuntary Commitment? No data recorded IVC Papers Initial File Date: No data recorded  [place] of Residence: No data recorded  Patient Currently Receiving the Following Services: No data recorded  Determination of Need: No data recorded  Options For Referral: No data recorded    CCA Biopsychosocial Intake/Chief Complaint:  The patient was referred by his PCP through the [place] with Anxiety and Depression  Current Symptoms/Problems: I am having anxiety and Depression and anger swings   Patient Reported Schizophrenia/Schizoaffective Diagnosis in Past: No   Strengths: Work Associate Professor , Marketing executive  Preferences: Working out, Therapist, music , [employer]: Working out Therapist, music, Engineer, civil (consulting)   Type of Services Patient Feels are Needed: Individual Therapy   Initial Clinical Notes/Concerns: The patient notes 2019 miltary counseling involvement. No current S/I or H/I no current Med Therapy for Mental Health.   Mental Health Symptoms Depression:   Difficulty Concentrating; Fatigue; Irritability; Sleep (too much or little); Hopelessness   Duration of Depressive symptoms:  Greater than two weeks   Mania:   None   Anxiety:    Difficulty concentrating; Fatigue; Irritability; Restlessness; Sleep; Tension; Worrying   Psychosis:   None   Duration of Psychotic symptoms: NA  Trauma:   Avoids reminders of event; Detachment from others; Irritability/anger; Re-experience of traumatic event; Difficulty staying/falling asleep; Hypervigilance   Obsessions:   None   Compulsions:   None   Inattention:   None   Hyperactivity/Impulsivity:   None    Oppositional/Defiant Behaviors:   None   Emotional Irregularity:  frequent Irritability  Other Mood/Personality Symptoms:   NA    Mental Status Exam Appearance and self-care  Stature:   Average   Weight:   Overweight   Clothing:   Casual   Grooming:   Normal   Cosmetic use:   None   Posture/gait:   Normal   Motor activity:   Not Remarkable   Sensorium  Attention:   Normal   Concentration:   Anxiety interferes   Orientation:   X5   Recall/memory:   Normal   Affect and Mood  Affect:   Appropriate   Mood:   Anxious; Depressed   Relating  Eye contact:   Normal   Facial expression:   Responsive   Attitude toward examiner:   Cooperative   Thought and Language  Speech flow:  Normal   Thought content:   Appropriate to Mood and Circumstances   Preoccupation:   None   Hallucinations:   None   Organization:  Logical  Company secretary of Knowledge:   Good   Intelligence:   Average   Abstraction:   Normal   Judgement:   Good   Reality Testing:   Realistic   Insight:   Good   Decision Making:   Normal   Social Functioning  Social Maturity:   Responsible   Social Judgement:   Normal   Stress  Stressors:   Work  Coping Ability:   Normal   Skill Deficits:   None   Supports:   Family     Religion: Religion/Spirituality Are You A Religious Person?: No  Leisure/Recreation: Leisure / Recreation Do You Have Hobbies?: Yes Leisure and Hobbies: Firefighter  Exercise/Diet: Exercise/Diet Do You Exercise?: Yes What Type of Exercise Do You Do?: Run/Walk, Hiking, Weight Training How Many Times a Week Do You Exercise?: 4-5 times a week Have You Gained or Lost A Significant Amount of Weight in the Past Six Months?: No Do You Follow a Special Diet?: No Do You Have Any Trouble Sleeping?: Yes Explanation of Sleeping Difficulties: The patient notes having night terrors and difficulty staying  asleep   CCA Employment/Education Employment/Work Situation: Employment / Work Situation Employment Situation: Employed 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: Counseling  Education: Education Is Patient Currently Attending School?: No Last Grade Completed: 12 Name of High School: [employer] Did You Graduate From [employer]?: Yes Did Theme park manager?: Yes What Type of College Degree Do you Have?: [employer] BA in Health Science Did You Attend Graduate School?: No Did You Have An Individualized Education Program (IIEP): No Did You Have Any Difficulty At School?: No Patient's Education Has Been Impacted by Current Illness: No   CCA Family/Childhood History Family and Relationship History: Family history Marital status: Long term relationship Long term relationship, how long?: Currently Engaged , with partner for the past 3 years What types of issues is patient dealing with in the relationship?: None Additional relationship information: None Are you sexually active?: Yes What is your sexual orientation?: Heterosexual Has your sexual activity been affected by drugs, alcohol, medication, or emotional stress?: NA Does patient have children?: Yes How many children?: 1 How is patient's relationship with their children?: The patient notes having a 13 and a half year old boy  Childhood History:  Childhood History By whom was/is the patient raised?: Both parents Additional childhood history  information: Mother passed away when patient was a kid Description of patient's relationship with caregiver when they were a child: The patient notes his relationship with his father was hit or miss sometimes good other times bad Patient's description of current relationship with people who raised him/her: The patient notes having a good relationship with his Father currently How were you disciplined when you got in trouble as a child/adolescent?: Grounding Does patient have siblings?: No Did patient suffer any verbal/emotional/physical/sexual abuse as a child?: No Did patient suffer from severe childhood neglect?: No Has patient ever been sexually abused/assaulted/raped as an adolescent or adult?: No Was the patient ever a victim of a crime or a disaster?: No Witnessed domestic violence?: No Has patient been affected by domestic violence as an adult?: No  Child/Adolescent Assessment:     CCA Substance Use Alcohol/Drug Use: Alcohol / Drug Use Pain Medications: MAR Prescriptions: See MAR Over the Counter: Vitamin D, Fish Oil, Multivitamin History of alcohol / drug use?: No history of alcohol / drug abuse  ASAM's:  Six Dimensions of Multidimensional Assessment  Dimension 1:  Acute Intoxication and/or Withdrawal Potential:      Dimension 2:  Biomedical Conditions and Complications:      Dimension 3:  Emotional, Behavioral, or Cognitive Conditions and Complications:     Dimension 4:  Readiness to Change:     Dimension 5:  Relapse, Continued use, or Continued Problem Potential:     Dimension 6:  Recovery/Living Environment:     ASAM Severity Score:    ASAM Recommended Level of Treatment:     Substance use Disorder (SUD)    Recommendations for Services/Supports/Treatments: Recommendations for Services/Supports/Treatments Recommendations For Services/Supports/Treatments: Medication Management, Individual Therapy  DSM5 Diagnoses: There are no  problems to display for this patient.   Patient Centered Plan: Patient is on the following Treatment Plan(s):  Recurrent Moderate MDD with Anxiety/ PTSD   Referrals to Alternative Service(s): Referred to Alternative Service(s):   Place:   Date:   Time:    Referred to Alternative Service(s):   Place:   Date:   Time:    Referred to Alternative Service(s):   Place:   Date:   Time:    Referred to Alternative Service(s):   Place:   Date:   Time:      Collaboration of Care: No Additional Collaboration  Patient/Guardian was advised Release of Information must be obtained prior to any record release in order to collaborate their care with an outside provider. Patient/Guardian was advised if they have not already done so to contact the registration department to sign all necessary forms in order for [place] to release information regarding their care.   Consent: Patient/Guardian gives verbal consent for treatment and assignment of benefits for services provided during this visit. Patient/Guardian expressed understanding and agreed to proceed.   I discussed the assessment and treatment plan with the patient. The patient was provided an opportunity to ask questions and all were answered. The patient agreed with the plan and demonstrated an understanding of the instructions.   The patient was advised to call back or seek an in-person evaluation if the symptoms worsen or if the condition fails to improve as anticipated.  I provided 60 minutes of non-face-to-face time during this encounter.  [name], LCSW  06/03/2023

## 2023-06-24 ENCOUNTER — Telehealth (HOSPITAL_COMMUNITY): Payer: Self-pay | Admitting: Clinical

## 2023-06-24 ENCOUNTER — Ambulatory Visit (HOSPITAL_COMMUNITY): Payer: No Typology Code available for payment source | Admitting: Clinical

## 2023-06-24 NOTE — Telephone Encounter (Signed)
The patient was a no response for this virtual visit

## 2024-01-07 ENCOUNTER — Ambulatory Visit (INDEPENDENT_AMBULATORY_CARE_PROVIDER_SITE_OTHER): Payer: No Typology Code available for payment source | Admitting: Clinical

## 2024-01-07 DIAGNOSIS — F329 Major depressive disorder, single episode, unspecified: Secondary | ICD-10-CM | POA: Diagnosis not present

## 2024-01-07 DIAGNOSIS — F4310 Post-traumatic stress disorder, unspecified: Secondary | ICD-10-CM | POA: Diagnosis not present

## 2024-01-07 DIAGNOSIS — F331 Major depressive disorder, recurrent, moderate: Secondary | ICD-10-CM

## 2024-01-07 DIAGNOSIS — F419 Anxiety disorder, unspecified: Secondary | ICD-10-CM | POA: Diagnosis not present

## 2024-01-07 NOTE — Progress Notes (Signed)
Virtual Visit via Video Note  I connected with Bradley Dorsey on 01/07/24 at 11:00 AM EST by a video enabled telemedicine application and verified that I am speaking with the correct person using two identifiers.  Location: Patient: home Provider: office   I discussed the limitations of evaluation and management by telemedicine and the availability of in person appointments. The patient expressed understanding and agreed to proceed.  THERAPIST PROGRESS NOTE   Session Time: 11:00 AM- 11:30 AM   Participation Level: Active   Behavioral Response: CasualAlertAnxious   Type of Therapy: Individual Therapy   Treatment Goals addressed: Anxiety and Mood   Interventions: CBT, Solution Focused and Supportive   Summary: Bradley Dorsey. [name] is a 28 y.o. male who presents with Depression with Anxiety and PTSD . The OPT therapist utilized Motivational Interviewing to assist in creating therapeutic repore. The patient in the session was engaged and work in collaboration giving feedback about his triggers and symptoms over the past few weeks . The patient spoke about stressors including financial stress. The OPT therapist utilized Cognitive Behavioral Therapy through cognitive restructuring as well as worked with the patient on coping strategies to assist in management of anxiety and mood. The OPT therapist placed emphasis on the patient utilizing the in my control not in my control tool. The OPT therapist overviewed change of weather coping strategies and in home meaningful distractions to assist patient in changing gears and not fixating on negative thoughts.   Suicidal/Homicidal: Nowithout intent/plan   Therapist Response: The patient was engaged in his session and gave feedback in relation to triggers, symptoms, and behavior responses over the past few weeks. The OPT therapist worked with the patient utilizing an in session Cognitive Behavioral Therapy exercise. The patient was responsive in the session and  verbalized, " I have been trying to take advantage when we have warmer weather getting outside and walking some and I have gotten back into my gym routine". The OPT therapist worked with the patient on work life balance and the patient indicated he is looking forward to upcoming vacation potentially to the beach in the Spring. The Opt therapist worked with the patient in overviewing basic need areas and pace with time management to ensure self care.The OPT therapist will continue treatment work with the patient in his next scheduled session.   Plan: Return again in 2/3 weeks.   Diagnosis:      Axis I: PTSD / MDD with Anxiety                       Axis II: No diagnosis   I discussed the assessment and treatment plan with the patient. The patient was provided an opportunity to ask questions and all were answered. The patient agreed with the plan and demonstrated an understanding of the instructions.   The patient was advised to call back or seek an in-person evaluation if the symptoms worsen or if the condition fails to improve as anticipated.   I provided 30 minutes of non-face-to-face time during this encounter.   [name], LCSW   01/07/2024

## 2024-02-03 ENCOUNTER — Ambulatory Visit (HOSPITAL_COMMUNITY): Payer: No Typology Code available for payment source | Admitting: Clinical
